# Patient Record
Sex: Male | Born: 1940 | Race: White | Hispanic: No | Marital: Married | Smoking: Former smoker
Health system: Southern US, Community
[De-identification: ages and names within clinical notes are randomized; demographics above are authoritative.]

## PROBLEM LIST (undated history)

## (undated) DIAGNOSIS — Z7901 Long term (current) use of anticoagulants: Secondary | ICD-10-CM

## (undated) DIAGNOSIS — Z79899 Other long term (current) drug therapy: Secondary | ICD-10-CM

## (undated) DIAGNOSIS — J45909 Unspecified asthma, uncomplicated: Secondary | ICD-10-CM

## (undated) DIAGNOSIS — I428 Other cardiomyopathies: Secondary | ICD-10-CM

## (undated) DIAGNOSIS — K4090 Unilateral inguinal hernia, without obstruction or gangrene, not specified as recurrent: Secondary | ICD-10-CM

## (undated) DIAGNOSIS — K5790 Diverticulosis of intestine, part unspecified, without perforation or abscess without bleeding: Secondary | ICD-10-CM

## (undated) DIAGNOSIS — K8020 Calculus of gallbladder without cholecystitis without obstruction: Secondary | ICD-10-CM

## (undated) DIAGNOSIS — I499 Cardiac arrhythmia, unspecified: Secondary | ICD-10-CM

## (undated) DIAGNOSIS — G4733 Obstructive sleep apnea (adult) (pediatric): Secondary | ICD-10-CM

## (undated) DIAGNOSIS — D696 Thrombocytopenia, unspecified: Secondary | ICD-10-CM

## (undated) DIAGNOSIS — R001 Bradycardia, unspecified: Secondary | ICD-10-CM

## (undated) DIAGNOSIS — I480 Paroxysmal atrial fibrillation: Secondary | ICD-10-CM

## (undated) DIAGNOSIS — L570 Actinic keratosis: Secondary | ICD-10-CM

## (undated) DIAGNOSIS — R55 Syncope and collapse: Secondary | ICD-10-CM

## (undated) HISTORY — DX: Calculus of gallbladder without cholecystitis without obstruction: K80.20

## (undated) HISTORY — PX: CHOLECYSTECTOMY: SHX55

## (undated) HISTORY — DX: Thrombocytopenia, unspecified: D69.6

## (undated) HISTORY — DX: Obstructive sleep apnea (adult) (pediatric): G47.33

## (undated) HISTORY — DX: Unspecified asthma, uncomplicated: J45.909

## (undated) HISTORY — DX: Other cardiomyopathies: I42.8

## (undated) HISTORY — PX: CARDIOVERSION: SHX1299

## (undated) HISTORY — DX: Cardiac arrhythmia, unspecified: I49.9

## (undated) HISTORY — DX: Actinic keratosis: L57.0

## (undated) HISTORY — DX: Other long term (current) drug therapy: Z79.899

## (undated) HISTORY — DX: Long term (current) use of anticoagulants: Z79.01

## (undated) HISTORY — DX: Diverticulosis of intestine, part unspecified, without perforation or abscess without bleeding: K57.90

## (undated) HISTORY — DX: Unilateral inguinal hernia, without obstruction or gangrene, not specified as recurrent: K40.90

---

## 2001-12-09 ENCOUNTER — Ambulatory Visit (HOSPITAL_COMMUNITY): Admission: RE | Admit: 2001-12-09 | Discharge: 2001-12-09 | Payer: Self-pay | Admitting: Internal Medicine

## 2008-06-11 DIAGNOSIS — K5790 Diverticulosis of intestine, part unspecified, without perforation or abscess without bleeding: Secondary | ICD-10-CM

## 2008-06-11 HISTORY — DX: Diverticulosis of intestine, part unspecified, without perforation or abscess without bleeding: K57.90

## 2008-07-27 ENCOUNTER — Encounter (INDEPENDENT_AMBULATORY_CARE_PROVIDER_SITE_OTHER): Payer: Self-pay | Admitting: *Deleted

## 2009-03-02 ENCOUNTER — Ambulatory Visit: Payer: Self-pay | Admitting: Internal Medicine

## 2009-03-04 ENCOUNTER — Telehealth: Payer: Self-pay | Admitting: Internal Medicine

## 2009-03-08 ENCOUNTER — Ambulatory Visit: Payer: Self-pay | Admitting: Internal Medicine

## 2009-05-18 ENCOUNTER — Ambulatory Visit (HOSPITAL_COMMUNITY): Admission: RE | Admit: 2009-05-18 | Discharge: 2009-05-19 | Payer: Self-pay | Admitting: General Surgery

## 2009-05-18 ENCOUNTER — Encounter (INDEPENDENT_AMBULATORY_CARE_PROVIDER_SITE_OTHER): Payer: Self-pay | Admitting: General Surgery

## 2010-06-11 DIAGNOSIS — I499 Cardiac arrhythmia, unspecified: Secondary | ICD-10-CM

## 2010-06-11 HISTORY — DX: Cardiac arrhythmia, unspecified: I49.9

## 2010-08-29 ENCOUNTER — Ambulatory Visit (HOSPITAL_COMMUNITY)
Admission: RE | Admit: 2010-08-29 | Discharge: 2010-08-29 | Disposition: A | Discharge status: Home / Self Care | Payer: 59 | Source: Ambulatory Visit | Attending: Cardiology | Admitting: Cardiology

## 2010-08-29 DIAGNOSIS — Z7901 Long term (current) use of anticoagulants: Secondary | ICD-10-CM | POA: Insufficient documentation

## 2010-08-29 DIAGNOSIS — I4891 Unspecified atrial fibrillation: Secondary | ICD-10-CM | POA: Insufficient documentation

## 2010-08-29 DIAGNOSIS — I428 Other cardiomyopathies: Secondary | ICD-10-CM | POA: Insufficient documentation

## 2010-08-29 DIAGNOSIS — Z01812 Encounter for preprocedural laboratory examination: Secondary | ICD-10-CM | POA: Insufficient documentation

## 2010-08-29 DIAGNOSIS — Z01810 Encounter for preprocedural cardiovascular examination: Secondary | ICD-10-CM | POA: Insufficient documentation

## 2010-08-29 LAB — BASIC METABOLIC PANEL
Chloride: 103 mEq/L (ref 96–112)
GFR calc Af Amer: >60 mL/min (ref >60)
GFR calc non Af Amer: >60 mL/min (ref >60)
Potassium: 3.6 mEq/L (ref 3.5–5.1)
Sodium: 137 mEq/L (ref 135–145)

## 2010-08-29 LAB — CBC
Hemoglobin: 15.4 g/dL (ref 13.0–17.0)
MCH: 30.3 pg (ref 26.0–34.0)
MCHC: 35 g/dL (ref 30.0–36.0)
MCV: 86.6 fL (ref 78.0–100.0)
Platelets: 88 10*3/uL — ABNORMAL LOW (ref 150–400)
RBC: 5.08 MIL/uL (ref 4.22–5.81)
RDW: 16.7 % — ABNORMAL HIGH (ref 11.5–15.5)
WBC: 6.6 10*3/uL (ref 4.0–10.5)

## 2010-08-29 LAB — MAGNESIUM: Magnesium: 2 mg/dL (ref 1.5–2.5)

## 2010-08-29 LAB — PROTIME-INR: Prothrombin Time: 32.2 seconds — ABNORMAL HIGH (ref 11.6–15.2)

## 2010-08-29 LAB — APTT: aPTT: 40 seconds — ABNORMAL HIGH (ref 24–37)

## 2010-08-30 NOTE — Op Note (Signed)
  NAME:  Joseph Allison, MEDINA NO.:  [id number]  MEDICAL RECORD NO.:  [id number]           PATIENT TYPE:  O  LOCATION:  [facility]                         FACILITY:  [facility]  PHYSICIAN:  Armanda Magic, M.D.     DATE OF BIRTH:  Jan 20, 1941  DATE OF PROCEDURE:  08/29/2010 DATE OF DISCHARGE:  08/29/2010                              OPERATIVE REPORT   REFERRING PHYSICIAN:  Dr. Berenda Morale.  PROCEDURE:  Current cardioversion.  OPERATOR:  Armanda Magic, MD  INDICATIONS:  Atrial fibrillation.  COMPLICATIONS:  None.  IV MEDICATIONS:  Propofol 160 mg and lidocaine 60 mg.  This is a 70 year old male who recently presented with new onset atrial fibrillation.  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  He has been on Coumadin with a therapeutic INR greater than 2 for over 4 weeks and now presents for cardioversion.  [facility] nonsedated state.  Informed consent was obtained.  The patient was connected to continuous heart rate and pulse oximetry monitoring and intermittent blood pressure monitoring.  Defibrillator pads were placed in the left anterior chest and posterior back.  After adequate anesthesia was obtained, a 150-joule synchronized biphasic shock was delivered, which successfully converted the patient to sinus bradycardia.  The patient tolerated the procedure well without complications.  The patient subsequently was later discharged to home after fully awake and ambulating.  ASSESSMENT: 1. Atrial fibrillation. 2. Systemic anticoagulation with therapeutic INR for 4 weeks. 3. Successful cardioversion to sinus bradycardia.  PLAN:  Discharged home after fully awake and ambulating.  We will follow up with my nurse practitioner in 2 weeks dictation     Armanda Magic, M.D.     TT/MEDQ  D:  08/29/2010  T:  08/30/2010  Job:  [id number]  cc:    Berenda Morale, MD  Electronically Signed by Armanda Magic M.D. on 08/30/2010 09:31:46 AM

## 2010-09-12 LAB — COMPREHENSIVE METABOLIC PANEL
ALT: 21 U/L (ref 0–53)
Albumin: 4.1 g/dL (ref 3.5–5.2)
Alkaline Phosphatase: 69 U/L (ref 39–117)
BUN: 13 mg/dL (ref 6–23)
Chloride: 103 mEq/L (ref 96–112)
Glucose, Bld: 83 mg/dL (ref 70–99)
Potassium: 4.2 mEq/L (ref 3.5–5.1)
Sodium: 141 mEq/L (ref 135–145)
Total Bilirubin: 2.9 mg/dL — ABNORMAL HIGH (ref 0.3–1.2)

## 2010-09-12 LAB — DIFFERENTIAL
Basophils Absolute: 0 10*3/uL (ref 0.0–0.1)
Basophils Relative: 1 % (ref 0–1)
Eosinophils Absolute: 0.1 10*3/uL (ref 0.0–0.7)
Monocytes Absolute: 0.4 10*3/uL (ref 0.1–1.0)
Neutro Abs: 4.6 10*3/uL (ref 1.7–7.7)

## 2010-09-12 LAB — CBC
HCT: 42.3 % (ref 39.0–52.0)
Hemoglobin: 14.5 g/dL (ref 13.0–17.0)
Platelets: 134 10*3/uL — ABNORMAL LOW (ref 150–400)
WBC: 6.2 10*3/uL (ref 4.0–10.5)

## 2010-09-12 LAB — APTT: aPTT: 29 seconds (ref 24–37)

## 2010-12-26 ENCOUNTER — Ambulatory Visit (HOSPITAL_COMMUNITY)
Admission: RE | Admit: 2010-12-26 | Discharge: 2010-12-26 | Disposition: A | Discharge status: Home / Self Care | Payer: 59 | Source: Ambulatory Visit | Attending: Cardiology | Admitting: Cardiology

## 2010-12-26 DIAGNOSIS — Z7901 Long term (current) use of anticoagulants: Secondary | ICD-10-CM | POA: Insufficient documentation

## 2010-12-26 DIAGNOSIS — I428 Other cardiomyopathies: Secondary | ICD-10-CM | POA: Insufficient documentation

## 2010-12-26 DIAGNOSIS — I4891 Unspecified atrial fibrillation: Secondary | ICD-10-CM | POA: Insufficient documentation

## 2010-12-26 LAB — PROTIME-INR: INR: 2.96 — ABNORMAL HIGH (ref 0.00–1.49)

## 2010-12-26 LAB — MAGNESIUM: Magnesium: 1.9 mg/dL (ref 1.5–2.5)

## 2010-12-28 NOTE — Op Note (Signed)
  NAME:  Joseph Allison, ALVIAR NO.:  [id number]  MEDICAL RECORD NO.:  [id number]  LOCATION:  [facility]                         FACILITY:  [facility]  PHYSICIAN:  Armanda Magic, M.D.     DATE OF BIRTH:  10-13-1940  DATE OF PROCEDURE:  12/26/2010 DATE OF DISCHARGE:  12/26/2010                              OPERATIVE REPORT   REFERRING PHYSICIAN:  Kandyce Rud, MD  PROCEDURE:  Direct current cardioversion.  OPERATOR:  Armanda Magic, MD  INDICATIONS:  Atrial fibrillation.  COMPLICATIONS:  None.  MEDICATIONS:  Propofol 120 mg IV.  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  Of note, his EF had improved to 45% from in the 30s.  He now presents for cardioversion.  [facility] nonsedated state.  Informed consent was obtained.  The patient was connected to continuous heart rate and pulse oximetry monitoring and intermittent blood pressure monitoring.  Defibrillator pads were placed on the left anterior chest and back.  After adequate anesthesia was obtained, a 150- joule synchronized biphasic shock was delivered which was unsuccessful in converting the patient to sinus rhythm.  A 200-joule synchronized biphasic shock was delivered, which again was unsuccessful in converting the patient to sinus rhythm.  The patient tolerated the procedure well and was later discharged to home.  ASSESSMENT: 1. Atrial fibrillation. 2. Systemic anticoagulation with supratherapeutic INR at 3.99. 3. Unsuccessful cardioversion.  PLAN:  Discharge to home after fully awake and ambulating.  I have discussed the options for further treatment with antiarrhythmic drugs with his wife.  They would like to have a visit in the office to discuss his options.  We will set up an  appointment on Thursday, December 28, 2010, at 3:45 for further discussion.  Of note, his INR was 3.99 on December 19, 2010, so I have told him to hold his warfarin today and restart at regular dose tomorrow and he will follow up in our Coumadin Clinic at the time of his appointment on August 28, 2010.     Armanda Magic, M.D.     TT/MEDQ  D:  12/26/2010  T:  12/27/2010  Job:  [id number]  Electronically Signed by Armanda Magic M.D. on 12/28/2010 09:17:55 AM

## 2011-01-03 ENCOUNTER — Inpatient Hospital Stay (HOSPITAL_COMMUNITY)
Admission: AD | Admit: 2011-01-03 | Discharge: 2011-01-06 | DRG: 310 | Disposition: A | Discharge status: Home / Self Care | Payer: Managed Care, Other (non HMO) | Source: Ambulatory Visit | Attending: Cardiology | Admitting: Cardiology

## 2011-01-03 DIAGNOSIS — I498 Other specified cardiac arrhythmias: Secondary | ICD-10-CM | POA: Diagnosis present

## 2011-01-03 DIAGNOSIS — I428 Other cardiomyopathies: Secondary | ICD-10-CM | POA: Diagnosis present

## 2011-01-03 DIAGNOSIS — G4733 Obstructive sleep apnea (adult) (pediatric): Secondary | ICD-10-CM | POA: Diagnosis present

## 2011-01-03 DIAGNOSIS — R791 Abnormal coagulation profile: Secondary | ICD-10-CM | POA: Diagnosis present

## 2011-01-03 DIAGNOSIS — I4891 Unspecified atrial fibrillation: Principal | ICD-10-CM | POA: Diagnosis present

## 2011-01-03 LAB — CBC
Hemoglobin: 16.6 g/dL (ref 13.0–17.0)
MCH: 34.3 pg — ABNORMAL HIGH (ref 26.0–34.0)
Platelets: 108 10*3/uL — ABNORMAL LOW (ref 150–400)
RBC: 4.84 MIL/uL (ref 4.22–5.81)
WBC: 6.8 10*3/uL (ref 4.0–10.5)

## 2011-01-03 LAB — MAGNESIUM: Magnesium: 2.1 mg/dL (ref 1.5–2.5)

## 2011-01-03 LAB — BASIC METABOLIC PANEL
CO2: 32 mEq/L (ref 19–32)
CO2: 31 mEq/L (ref 19–32)
Calcium: 9 mg/dL (ref 8.4–10.5)
Chloride: 101 mEq/L (ref 96–112)
Creatinine, Ser: 0.85 mg/dL (ref 0.50–1.35)
GFR calc Af Amer: >60 mL/min (ref >60)
Glucose, Bld: 89 mg/dL (ref 70–99)
Potassium: 3.8 mEq/L (ref 3.5–5.1)
Potassium: 3.8 mEq/L (ref 3.5–5.1)
Sodium: 138 mEq/L (ref 135–145)
Sodium: 138 mEq/L (ref 135–145)

## 2011-01-03 LAB — DIFFERENTIAL
Basophils Absolute: 0 10*3/uL (ref 0.0–0.1)
Eosinophils Absolute: 0.1 10*3/uL (ref 0.0–0.7)
Lymphocytes Relative: 24 % (ref 12–46)
Monocytes Absolute: 0.5 10*3/uL (ref 0.1–1.0)
Neutrophils Relative %: 66 % (ref 43–77)

## 2011-01-03 LAB — PROTIME-INR
INR: 3.13 — ABNORMAL HIGH (ref 0.00–1.49)
Prothrombin Time: 32.7 seconds — ABNORMAL HIGH (ref 11.6–15.2)

## 2011-01-04 ENCOUNTER — Ambulatory Visit: Payer: 59 | Admitting: Hematology & Oncology

## 2011-01-04 LAB — PROTIME-INR
INR: 2.95 — ABNORMAL HIGH (ref 0.00–1.49)
Prothrombin Time: 31.2 seconds — ABNORMAL HIGH (ref 11.6–15.2)

## 2011-01-04 LAB — BASIC METABOLIC PANEL
CO2: 30 mEq/L (ref 19–32)
Calcium: 8.8 mg/dL (ref 8.4–10.5)
Chloride: 103 mEq/L (ref 96–112)
Creatinine, Ser: 0.64 mg/dL (ref 0.50–1.35)
GFR calc Af Amer: >60 mL/min (ref >60)
Sodium: 139 mEq/L (ref 135–145)

## 2011-01-04 LAB — MAGNESIUM: Magnesium: 2.1 mg/dL (ref 1.5–2.5)

## 2011-01-05 LAB — PROTIME-INR: Prothrombin Time: 29.8 seconds — ABNORMAL HIGH (ref 11.6–15.2)

## 2011-01-05 LAB — MAGNESIUM: Magnesium: 2 mg/dL (ref 1.5–2.5)

## 2011-01-05 LAB — BASIC METABOLIC PANEL
Calcium: 9.1 mg/dL (ref 8.4–10.5)
Creatinine, Ser: 0.59 mg/dL (ref 0.50–1.35)
GFR calc Af Amer: >60 mL/min (ref >60)
GFR calc non Af Amer: >60 mL/min (ref >60)

## 2011-01-06 LAB — BASIC METABOLIC PANEL
BUN: 14 mg/dL (ref 6–23)
Calcium: 9.4 mg/dL (ref 8.4–10.5)
Chloride: 99 mEq/L (ref 96–112)
Creatinine, Ser: 0.64 mg/dL (ref 0.50–1.35)
GFR calc Af Amer: >60 mL/min (ref >60)

## 2011-01-11 NOTE — Discharge Summary (Signed)
NAME:  Joseph Allison, Joseph Allison NO.:  [id number]  MEDICAL RECORD NO.:  [id number]  LOCATION:  3706                         FACILITY:  [facility]  PHYSICIAN:  Armanda Magic, M.D.     DATE OF BIRTH:  02/10/1941  DATE OF ADMISSION:  01/03/2011 DATE OF DISCHARGE:  01/06/2011                              DISCHARGE SUMMARY   ADMISSION DIAGNOSES: 1. Atrial fibrillation, status post failed cardioversion. 2. Dilated cardiomyopathy, felt secondary to tachycardia-induced     cardiomyopathy. 3. Moderate obstructive sleep apnea.  DISCHARGE DIAGNOSES: 1. Atrial fibrillation with chemical cardioversion to sinus     bradycardia on Tikosyn. 2. Sinus bradycardia. 3. Systemic anticoagulation with therapeutic INR. 4. Dilated cardiomyopathy, felt secondary to tachycardia-induced     cardiomyopathy. 5. Moderate obstructive sleep apnea.  PROCEDURES:  None.  This is a 70 year old male who has a history of paroxysmal atrial fibrillation dating back to January 2012 at which time he was admitted with heart failure and was diagnosed with dilated cardiomyopathy felt secondary to tachycardia-induced cardiomyopathy from his atrial fibrillation.  He was loaded on Coumadin and subsequently underwent cardioversion in March 2012.  He then reverted back to atrial fibrillation despite metoprolol.  He underwent recent cardioversion unsuccessfully and presented on January 03, 2011, for drug loading with Tikosyn.  HOSPITAL COURSE:  The patient on admission had a low potassium at 3.8 and that was repleted.  His magnesium was normal at 2.1.  He was subsequently started on Tikosyn 500 mcg q.12 h.  His QTc was maintained below 500 milliseconds.  He did have some issues with keeping his potassium above 4, so his potassium was increased to 40 mEq a day and Aldactone 25 mg 1/2 tablet daily was added.  He did have some bradycardia during his hospital stay that was asymptomatic and his metoprolol was stopped.  On  the day of discharge, he was doing well and ambulating without complaints.  No arrhythmias were documented on his telemetry and he was in sinus bradycardia.  He received his fifth dose of Tikosyn prior to discharge and the EKG with QTc 2 hours after Tikosyn dose was pending at the time of discharge.  His discharge medications include: 1. Aldactone 25 mg 1/2 tablet daily. 2. Dofetilide 500 mcg 1 tablet every 12 hours. 3. Potassium chloride 40 mEq 1 tablet daily. 4. Centrum Silver multivitamin daily. 5. Furosemide 20 mg daily. 6. Glucosamine/chondroitin 2 tablets daily. 7. Ramipril 5 mg daily. 8. Warfarin 1 mg 1 tablet every evening.  He was instructed to stop his metoprolol.  FOLLOWUP:  He will follow up with me in my office on January 12, 2011, at 9:45 a.m.  He will go to my lab on January 08, 2011, at 9 a.m. for a basic metabolic panel and he will follow up in Coumadin Clinic with Alfonse Ras on January 12, 2011, at 9:45 a.m.  I have spent a total of 45 minutes in preparing this patient's discharge including preparing this discharge note, reviewing the patient's medications and followup with him, preparing the patient's discharge medication prescriptions and discharge medication list as well as dictating this dictation.     Armanda Magic, M.D.     TT/MEDQ  D:  01/06/2011  T:  01/06/2011  Job:  [id number]  cc:   Kandyce Rud, MD  Electronically Signed by Armanda Magic M.D. on 01/11/2011 08:54:09 AM

## 2011-01-11 NOTE — H&P (Signed)
NAME:  Joseph Allison, Joseph Allison NO.:  [id number]  MEDICAL RECORD NO.:  [id number]  LOCATION:  3706                         FACILITY:  [facility]  PHYSICIAN:  Armanda Magic, M.D.     DATE OF BIRTH:  11-24-1940  DATE OF ADMISSION:  01/03/2011 DATE OF DISCHARGE:  01/06/2011                             HISTORY & PHYSICAL   CHIEF COMPLAINT:  Atrial fibrillation.  HISTORY OF PRESENT ILLNESS:  This is a 70 year old male with a history of paroxysmal atrial fibrillation dating back to January 2012, at which time he was admitted with heart failure, was diagnosed with a dilated cardiomyopathy felt secondary to tachycardia-induced cardiomyopathy from his atrial fibrillation.  He was loaded on Coumadin, subsequently underwent cardioversion in March 2012.  He then reverted back to atrial fibrillation despite metoprolol.  The patient underwent recent cardioversion which was unsuccessful and presents today for drug loading with Tikosyn.  Of note, Tikosyn was chosen because of the patient's cardiomyopathy, present use of Rythmol or flecainide.  The patient is adamant that he does not want to try amiodarone.  PAST MEDICAL HISTORY: 1. Atrial fibrillation. 2. Systemic anticoagulation. 3. 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  EF is improved to 40-45%. 4. Obstructive sleep apnea, on BiPAP therapy. 5. Inguinal hernia.  SURGICAL HISTORY:  Status post cholecystectomy in December 2011.  FAMILY HISTORY:  His father was killed in World War II.  His mother is deceased from colon and breast CA.  Has one sister alive with cervical CA.  SOCIAL HISTORY:  He is married.  He is a former smoker and smoked for 40 years.  He denies any alcohol use or IV drug use.  ALLERGIES:  None.  CURRENT MEDICATIONS: 1. Multivitamin daily. 2. Glucosamine 500 mg 2 tablets daily. 3. Lasix 20 mg daily. 4. Ramipril 5 mg daily. 5. Metoprolol tartrate  100 mg one-half tablet b.i.d. 6. Warfarin.  REVIEW OF SYSTEMS:  Other than what is stated in the HPI is negative.  PHYSICAL EXAMINATION:  GENERAL:  This is a well-developed, well- nourished male, in no acute distress. HEENT:  Benign. NECK:  Supple without lymphadenopathy.  Carotid strokes are +2 bilaterally, no bruits. LUNGS:  Clear to auscultation throughout. HEART:  Irregularly irregular.  No murmurs, rubs, or gallops.  Normal S1 and S2. ABDOMEN:  Soft, nontender, nondistended.  Normoactive bowel sounds.  No hepatosplenomegaly. EXTREMITIES:  No edema.  LABORATORY DATA:  Labs are pending at the time of admission.  EKG shows atrial fibrillation with controlled ventricular response.  ASSESSMENT: 1. Atrial fibrillation, status post failed cardioversion, now admitted     for drug loading with Tikosyn. 2. Dilated cardiomyopathy, felt secondary to tachycardia-induced     cardiomyopathy with improvement in the EF with control of heart     rate, EF now 45% by recent echocardiogram. 3. Moderate obstructive sleep apnea.  PLAN:  We will admit to telemetry bed.  We will check magnesium and potassium, and if levels are within recommended guidelines of potassium greater than 4, magnesium greater than 2, we will start Tikosyn at 500 mg q.12 h.  We will follow his QTC  closely.  If after 5 doses of Tikosyn he has not converted spontaneously on his own, we will plan direct current cardioversion before discharge.  He will continue on systemic anticoagulation per pharmacy protocol.     Armanda Magic, M.D.     TT/MEDQ  D:  01/09/2011  T:  01/10/2011  Job:  [id number]  Electronically Signed by Armanda Magic M.D. on 01/11/2011 08:54:13 AM

## 2011-01-29 ENCOUNTER — Ambulatory Visit (HOSPITAL_BASED_OUTPATIENT_CLINIC_OR_DEPARTMENT_OTHER): Payer: Managed Care, Other (non HMO) | Admitting: Hematology & Oncology

## 2011-01-29 ENCOUNTER — Other Ambulatory Visit: Payer: Self-pay | Admitting: Hematology & Oncology

## 2011-01-29 DIAGNOSIS — D6949 Other primary thrombocytopenia: Secondary | ICD-10-CM

## 2011-01-29 LAB — CBC WITH DIFFERENTIAL (CANCER CENTER ONLY)
BASO#: 0 10*3/uL (ref 0.0–0.2)
EOS%: 0.6 % (ref 0.0–7.0)
HCT: 43.9 % (ref 38.7–49.9)
HGB: 16.7 g/dL (ref 13.0–17.1)
LYMPH#: 1 10*3/uL (ref 0.9–3.3)
MCHC: 38 g/dL — ABNORMAL HIGH (ref 32.0–35.9)
MONO#: 0.5 10*3/uL (ref 0.1–0.9)
NEUT%: 76.1 % (ref 40.0–80.0)

## 2011-01-29 LAB — TECHNOLOGIST REVIEW CHCC SATELLITE

## 2011-01-31 LAB — VITAMIN B12: Vitamin B-12: 813 pg/mL (ref 211–911)

## 2011-01-31 LAB — PROTEIN ELECTROPHORESIS, SERUM
Albumin ELP: 65 % (ref 55.8–66.1)
Alpha-1-Globulin: 3.1 % (ref 2.9–4.9)

## 2011-01-31 LAB — FERRITIN: Ferritin: 166 ng/mL (ref 22–322)

## 2011-04-03 ENCOUNTER — Encounter: Payer: Self-pay | Admitting: *Deleted

## 2011-04-25 ENCOUNTER — Encounter: Payer: Self-pay | Admitting: Hematology & Oncology

## 2011-04-25 ENCOUNTER — Other Ambulatory Visit: Payer: Self-pay | Admitting: Hematology & Oncology

## 2011-04-25 ENCOUNTER — Other Ambulatory Visit (HOSPITAL_BASED_OUTPATIENT_CLINIC_OR_DEPARTMENT_OTHER): Payer: Managed Care, Other (non HMO) | Admitting: Lab

## 2011-04-25 ENCOUNTER — Ambulatory Visit (HOSPITAL_BASED_OUTPATIENT_CLINIC_OR_DEPARTMENT_OTHER): Payer: Managed Care, Other (non HMO) | Admitting: Hematology & Oncology

## 2011-04-25 VITALS — BP 103/62 | HR 51 | Temp 96.7°F | Ht 69.5 in | Wt 171.0 lb

## 2011-04-25 DIAGNOSIS — D6949 Other primary thrombocytopenia: Secondary | ICD-10-CM

## 2011-04-25 DIAGNOSIS — D696 Thrombocytopenia, unspecified: Secondary | ICD-10-CM | POA: Insufficient documentation

## 2011-04-25 LAB — CBC WITH DIFFERENTIAL (CANCER CENTER ONLY)
BASO#: 0 10*3/uL (ref 0.0–0.2)
BASO%: 0.5 % (ref 0.0–2.0)
EOS%: 0.8 % (ref 0.0–7.0)
HCT: 39.6 % (ref 38.7–49.9)
HGB: 14.8 g/dL (ref 13.0–17.1)
LYMPH#: 0.9 10*3/uL (ref 0.9–3.3)
LYMPH%: 14.6 % (ref 14.0–48.0)
MCHC: 37.4 g/dL — ABNORMAL HIGH (ref 32.0–35.9)
MCV: 92 fL (ref 82–98)
NEUT%: 76.2 % (ref 40.0–80.0)
RDW: 14.5 % (ref 11.1–15.7)

## 2011-04-25 NOTE — Progress Notes (Signed)
CC:   Armanda Magic, M.D.  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  INTERIM HISTORY:  Joseph Allison comes in for followup.  We initially saw him back in August.  At that point in time, his platelet count was 112,000. We did a workup on him.  All his lab work looked okay.  Vitamin B12 is 813.  His LDH was normal at 181.  There was no monoclonal spike in his serum.  He has had no bleeding or bruising.  He is on Coumadin.  He is being followed by Dr. Mayford Knife.  He has had no cough or shortness of breath.  There has been no leg swelling.  He has had no rashes.  PHYSICAL EXAM:  General:  This is a well-developed, well-nourished white gentleman in no obvious distress.  Vital signs:  Temperature 96.7, pulse 51, respiratory rate 18, blood pressure 103/62, weight is 171.  Head and neck:  Shows a normocephalic, atraumatic skull.  There are no ocular or oral lesions.  There are no palpable cervical, supraclavicular lymph nodes.  Lungs:  Clear bilaterally.  There are no rales, wheezes or rhonchi.  Cardiac:  Irregular rate and rhythm.  His rate is well controlled.  There are no murmurs, rubs or bruits.  Abdomen:  Soft. Good bowel sounds.  There is no fluid wave.  There is no palpable hepatosplenomegaly.  Back:  No tenderness over the spine, ribs, or hips. Extremities:  Shows no clubbing, cyanosis or edema.  He has good range motion of his joints.  Skin: No rashes, ecchymosis or petechiae.  LAB:  White blood cell count 6.4, hemoglobin 14.8, hematocrit 39.3, platelet count 107.  Peripheral smear shows a normochromic, normocytic population of red blood cells.  There are no nucleated red blood cells. I see no target cells.  There is no rouleaux formation.  White cells were normal in morphology and maturation.  There are no hypersegmented polys.  Platelets are slightly decreased in number.  He has several large platelets.  Platelets are  well granulated.  IMPRESSION:  Joseph Allison is a 70 year old gentleman with thrombocytopenia.  Again, this might be idiopathic thrombocytopenic purpura.  If so, he does not need to be treated.  I suppose it also could be medication-related.  His platelet count is stable from my point of view.  I still do not see a problem with him being on Coumadin.  We will go ahead and plan to get him back in another 4 months.  I do not think he needs any blood work in between visits.   ______________________________ [name], M.D. PRE/MEDQ  D:  04/25/2011  T:  04/25/2011  Job:  [id number]

## 2011-04-25 NOTE — Progress Notes (Signed)
This office note has been dictated. CSN: [id number] -

## 2011-08-29 ENCOUNTER — Ambulatory Visit (HOSPITAL_BASED_OUTPATIENT_CLINIC_OR_DEPARTMENT_OTHER): Payer: Managed Care, Other (non HMO) | Admitting: Hematology & Oncology

## 2011-08-29 ENCOUNTER — Other Ambulatory Visit (HOSPITAL_BASED_OUTPATIENT_CLINIC_OR_DEPARTMENT_OTHER): Payer: Managed Care, Other (non HMO) | Admitting: Lab

## 2011-08-29 VITALS — BP 112/59 | HR 50 | Temp 96.5°F | Ht 69.5 in | Wt 169.0 lb

## 2011-08-29 DIAGNOSIS — D696 Thrombocytopenia, unspecified: Secondary | ICD-10-CM

## 2011-08-29 LAB — CBC WITH DIFFERENTIAL (CANCER CENTER ONLY)
BASO#: 0 10*3/uL (ref 0.0–0.2)
BASO%: 0.5 % (ref 0.0–2.0)
Eosinophils Absolute: 0.1 10*3/uL (ref 0.0–0.5)
HCT: 43.1 % (ref 38.7–49.9)
HGB: 15.8 g/dL (ref 13.0–17.1)
LYMPH#: 1 10*3/uL (ref 0.9–3.3)
LYMPH%: 15.7 % (ref 14.0–48.0)
MCV: 93 fL (ref 82–98)
MONO#: 0.5 10*3/uL (ref 0.1–0.9)
NEUT%: 75.3 % (ref 40.0–80.0)
RBC: 4.64 10*6/uL (ref 4.20–5.70)
WBC: 6.4 10*3/uL (ref 4.0–10.0)

## 2011-08-29 LAB — CHCC SATELLITE - SMEAR

## 2011-08-29 NOTE — Progress Notes (Signed)
This office note has been dictated.

## 2011-08-29 NOTE — Progress Notes (Signed)
CC:   Joseph Allison, M.D. Joseph Finders., M.D.  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  CURRENT THERAPY:  Observation.  INTERIM HISTORY:  Joseph Allison comes in for his 4 month followup.  He is doing well.  He is on Coumadin.  He has had no bleeding issues. Otherwise, he has had no change in medications.  He has had no issues with nausea or vomiting.  There has been no weight loss or weight gain. He has had no change in bowel or bladder habits.  There has been no cough.  PHYSICAL EXAM:  General: This is a well-developed, well-nourished, white gentleman in no obvious distress.  Vital signs: Show a temperature of 96, pulse 50, respiratory 20, blood pressure 112/59, and weight is 169. Head and neck exam shows a normocephalic, atraumatic skull.  There are no ocular or oral lesions.  There are no palpable cervical or supraclavicular lymph nodes.  Lungs are clear bilaterally.  Cardiac exam: Regular rate and rhythm with an occasional extra beat.  He has a 1/6 systolic ejection murmur.  Abdominal exam: Soft with good bowel sounds.  There is no palpable abdominal mass.  There is no fluid wave. There is no palpable hepatosplenomegaly. Back exam:  No tenderness over the spine, ribs, or hips.  Extremities: Shows no clubbing, cyanosis, or edema.  Neurological exam: Shows no focal neurological deficits. Skin exam" shows no rashes, ecchymoses, or petechia.  LABORATORY STUDIES:  White cell count 6.4, hemoglobin 15.8, hematocrit 43.1, and platelet count 103.  Peripheral smear shows no immature myeloid cells.  There are no nucleated red cells.  There is no Rouleaux formation.  Platelets are mildly decreased in number.  Platelets are well-granulated.  He has a few large platelets.  IMPRESSION:  Joseph Allison is a 71 year old gentleman with mild thrombocytopenia.  This is the probably mild chronic immune thrombocytopenia.  It also could be  medication-related.  I do not see that we need to do a bone marrow test on him at this point in time.  We will plan to get back in another 4 months for followup.  I still do not see a problem with him being on Coumadin.    ______________________________ [name], M.D. PRE/MEDQ  D:  08/29/2011  T:  08/29/2011  Job:  [id number]

## 2011-10-26 ENCOUNTER — Other Ambulatory Visit: Payer: Self-pay | Admitting: Cardiology

## 2011-10-29 ENCOUNTER — Encounter (HOSPITAL_COMMUNITY): Payer: Self-pay | Admitting: Respiratory Therapy

## 2011-10-30 ENCOUNTER — Other Ambulatory Visit: Payer: Self-pay | Admitting: Cardiology

## 2011-11-01 ENCOUNTER — Encounter (HOSPITAL_COMMUNITY)
Admission: RE | Disposition: A | Discharge status: Home / Self Care | Payer: Self-pay | Source: Ambulatory Visit | Attending: Cardiology

## 2011-11-01 ENCOUNTER — Ambulatory Visit (HOSPITAL_COMMUNITY)
Admission: RE | Admit: 2011-11-01 | Discharge: 2011-11-01 | Disposition: A | Discharge status: Home / Self Care | Payer: Managed Care, Other (non HMO) | Source: Ambulatory Visit | Attending: Cardiology | Admitting: Cardiology

## 2011-11-01 DIAGNOSIS — I4891 Unspecified atrial fibrillation: Secondary | ICD-10-CM | POA: Insufficient documentation

## 2011-11-01 DIAGNOSIS — Z538 Procedure and treatment not carried out for other reasons: Secondary | ICD-10-CM | POA: Insufficient documentation

## 2011-11-01 DIAGNOSIS — Z01810 Encounter for preprocedural cardiovascular examination: Secondary | ICD-10-CM | POA: Insufficient documentation

## 2011-11-01 SURGERY — CARDIOVERSION
Anesthesia: General

## 2011-11-02 ENCOUNTER — Other Ambulatory Visit: Payer: Self-pay | Admitting: Cardiology

## 2011-11-02 NOTE — H&P (Signed)
Office Visit     Patient: Joseph Allison Provider: Armanda Magic, MD  DOB: 05-06-1941 Age: 71 Y Sex: Male Date: 10/25/2011  Phone: [phone]   Address: [address], [place], [id number]  Pcp: MARC BABAOFF       Subjective:     CC:    1. FOLLOW UP & SEE JEREMY.        HPI:  General:  The patient presents today for followup of his OSA/afib and DCM. He denies any chest pain, SOB, DOE, palpitations, LE edema, dizziness or syncope. He tolerates his medications well. He walks the dog twice daily for exercise. He is doing well on his BiPAP therapy. He tolerates the mask and pressure. He feels refreshed when he gets up in the morning and has no daytime sleepiness..        ROS:  See HPI, A twelve system review was perfomed at today's visit. For pertinent positives and negatives see HPI.       Medical History: AKs followed by derm, Inguinal hernia, mild inc in Allison bili 03/2009 at [facility], patient instructed to f/u at [facility] office. , h/o cholelithiasis- cholecystectomy 05/2010 per Dr. Andrey Campanile, 07/03/10 new Atrial fibrillation --placed on Coumadin therapy, 07/07/10 echocardiogram with EF 32%, LVH, Cardiolite without ischemia, OSA - moderate, cardioversion 3/12 from Atrial fibrillation, 2012 nuclear stress test without ischemia but pt has cardiomyopathy with EF 32%, moderated OSA on BIPAP at 19/15cm H2O.        Family History:        Social History:  General:  History of smoking cigarettes: Former smoker.  no Smoking, smoked 40 yrs ago.  no Alcohol.  Caffeine: yes.  no Recreational drug use.  no Diet.  Exercise: yes.  Occupation: employed, Airline pilot for [employer].  Marital Status: married.        Medications: Multi-Vitamin Tablet 1 tablet Once a day, Glucosamine 500 MG Tablet 2 tablets daily, Potassium Chloride 40 MEQ Capsule Extended Release 1 capsule Once a day, Tikosyn 500 MCG Capsule 1 capsule Twice a day, Lasix 20 MG Tablet 1 tablet Once a day, Aldactone 25 MG Tablet 1/2  tablet Once a day, Ramipril 5 MG Capsule 1 capsule Once a day, Warfarin Sodium 1 MG Tablet per pharmD 1 mg qd except 2 mg M/F, Medication List reviewed and reconciled with the patient       Allergies: N.K.D.A.      Objective:     Vitals: Wt 170, Wt change -1 lb, Ht 71.5, BMI 23.38, Pulse sitting 56 irr, BP sitting 124/74.       Examination:  Cardiology, General:  GENERAL APPEARANCE: pleasant, NAD.  HEENT: unremarkable.  CAROTID UPSTROKE: normal, no bruit.  JVD: flat.  HEART SOUNDS: regular, normal S1, S2, no S3 or S4.  MURMUR: absent.  LUNGS: no rales or wheezes.  ABDOMEN: soft, non tender, positive bowel sounds, no masses felt.  EXTREMITIES: no leg edema.  PERIPHERAL PULSES: 2 plus bilateral.        Assessment:     Assessment:  1. Atrial fibrillation - 427.31 (Primary)  2. Anticoagulant long-term use - V58.61  3. Cardiomyopathy - 425.4  4. Obstructive sleep apnea (adult) (pediatric) - 327.23    Plan:     1. Atrial fibrillation Continue Tikosyn Capsule, 500 MCG, 1 capsule, Orally, Twice a day ; Continue Warfarin Sodium Tablet, 1 MG, per pharmD, Orally, 1 mg qd except 2 mg M/F ; Start Toprol XL Tablet Extended Release 24 Hour, 25 MG, 1  tablet, Orally, Once a day, 30 day(s), 30 .  Diagnostic Imaging:EKG atrial fibrillation with RVR, Josey,Butch 10/25/2011 09:03:51 AM > Alicianna Litchford M 10/25/2011 09:21:01 AM >  He is back in afib with RVR so I will add Toprol XL 25mg  daily and have him come back in for EKG tomorrow to make sure he his rate controlled. If he has been therapuetic with his INR for the past few times then we will set him up for DCCV.       2. Anticoagulant long-term use  The patient's PT/INR will be checked in clininc today and anticoagulation adjusted as needed and reviewed by me - refer to coumadin clinic note.       3. Cardiomyopathy Continue Lasix Tablet, 20 MG, 1 tablet, Orally, Once a day ; Continue Aldactone Tablet, 25 MG, 1/2 tablet, Orally, Once a day ;  Continue Ramipril Capsule, 5 MG, 1 capsule, Orally, Once a day .        Immunizations:        Labs:        Procedure Codes: 19147 EKG I AND R       Preventive:         Follow Up: 5/17 EKG check (Reason: afib/OSA/DCM)      Provider: Armanda Magic, MD  Patient: Joseph Allison DOB: 1940/08/01 Date: 10/25/2011

## 2011-12-26 ENCOUNTER — Ambulatory Visit (HOSPITAL_BASED_OUTPATIENT_CLINIC_OR_DEPARTMENT_OTHER): Payer: Managed Care, Other (non HMO) | Admitting: Hematology & Oncology

## 2011-12-26 ENCOUNTER — Other Ambulatory Visit (HOSPITAL_BASED_OUTPATIENT_CLINIC_OR_DEPARTMENT_OTHER): Payer: Managed Care, Other (non HMO) | Admitting: Lab

## 2011-12-26 VITALS — BP 91/53 | HR 48 | Temp 97.1°F | Ht 69.0 in | Wt 167.0 lb

## 2011-12-26 DIAGNOSIS — D696 Thrombocytopenia, unspecified: Secondary | ICD-10-CM

## 2011-12-26 DIAGNOSIS — D693 Immune thrombocytopenic purpura: Secondary | ICD-10-CM

## 2011-12-26 LAB — CBC WITH DIFFERENTIAL (CANCER CENTER ONLY)
BASO#: 0 10*3/uL (ref 0.0–0.2)
Eosinophils Absolute: 0.1 10*3/uL (ref 0.0–0.5)
HCT: 40.2 % (ref 38.7–49.9)
HGB: 14.7 g/dL (ref 13.0–17.1)
LYMPH%: 18.7 % (ref 14.0–48.0)
MCH: 33.9 pg — ABNORMAL HIGH (ref 28.0–33.4)
MCV: 93 fL (ref 82–98)
MONO#: 0.5 10*3/uL (ref 0.1–0.9)
NEUT%: 72.1 % (ref 40.0–80.0)
RBC: 4.34 10*6/uL (ref 4.20–5.70)
WBC: 6.7 10*3/uL (ref 4.0–10.0)

## 2011-12-26 LAB — CHCC SATELLITE - SMEAR

## 2011-12-26 NOTE — Progress Notes (Signed)
This office note has been dictated.

## 2011-12-27 NOTE — Progress Notes (Signed)
CC:   Armanda Magic, M.D.  DIAGNOSIS:  Chronic immune thrombocytopenia.  CURRENT THERAPY:  Observation.  INTERIM HISTORY:  Mr. Joseph Allison comes in for followup.  We last saw him back in March.  Since we have been seeing him his platelet count really has been holding steady.  He is on Coumadin.  He continues on Coumadin.  He has this monitored every month or so.  Thankfully, there has been no problems with bleeding.  He has had no change in his medications.  He has not noticed any rashes. He has had no joint aches or pains.  He has not noticed any cough. There is no headache.  PHYSICAL EXAMINATION:  This is a well-developed, well-nourished white gentleman in no obvious distress.  Vital signs:  Temperature of 97.1, pulse 48, respiratory rate 18, blood pressure, 91/53, weight is 167. Head and neck:  Normocephalic, atraumatic skull.  There are no ocular or oral lesions.  There are no palpable cervical or supraclavicular lymph nodes.  Lungs:  Clear bilaterally.  Cardiac:  Regular rate and rhythm with a normal S1 and S2.  There are no murmurs, rubs or bruits. Abdomen:  Soft with good bowel sounds.  There is no palpable abdominal mass.  There is no palpable hepatosplenomegaly.  Back:  No tenderness of the spine, ribs, or hips.  Extremities:  Shows no clubbing, cyanosis or edema.  He has good range motion of his joints.  Neurological:  Shows no focal neurological deficits.  LABORATORY STUDIES:  White cell count is 86.7, hemoglobin 14.7, hematocrit 40.2, platelet count 111.  Peripheral smear shows good maturation of his red blood cells and white blood cells.  There are no nucleated red blood cells.  I see no teardrop cells.  He has no hypersegmented polys.  There is no immature myeloid or lymphoid cells. I do not see any blasts.  Platelets are adequate in number and size. Platelets are well-granulated.  He has a few large platelets.  IMPRESSION:  Mr. Joseph Allison is a 71 year old gentleman with  immune thrombocytopenia.  I suspect that this is likely the diagnosis for him.  His platelet count has really not changed in 6 months or so.  We will plan to get him back now in 6 months' time.  I do not see any need for any lab work in between visits.   ______________________________ [name], M.D. PRE/MEDQ  D:  12/26/2011  T:  12/27/2011  Job:  [id number]

## 2012-06-25 ENCOUNTER — Other Ambulatory Visit: Payer: Self-pay | Admitting: *Deleted

## 2012-06-25 ENCOUNTER — Other Ambulatory Visit (HOSPITAL_BASED_OUTPATIENT_CLINIC_OR_DEPARTMENT_OTHER): Payer: Managed Care, Other (non HMO) | Admitting: Lab

## 2012-06-25 ENCOUNTER — Ambulatory Visit (HOSPITAL_BASED_OUTPATIENT_CLINIC_OR_DEPARTMENT_OTHER): Payer: Managed Care, Other (non HMO) | Admitting: Medical

## 2012-06-25 VITALS — BP 94/64 | HR 51 | Temp 97.3°F | Resp 18 | Ht 69.0 in | Wt 173.0 lb

## 2012-06-25 DIAGNOSIS — D693 Immune thrombocytopenic purpura: Secondary | ICD-10-CM

## 2012-06-25 DIAGNOSIS — D696 Thrombocytopenia, unspecified: Secondary | ICD-10-CM

## 2012-06-25 LAB — CBC WITH DIFFERENTIAL (CANCER CENTER ONLY)
BASO#: 0 10*3/uL (ref 0.0–0.2)
EOS%: 1 % (ref 0.0–7.0)
HCT: 43.3 % (ref 38.7–49.9)
HGB: 15.9 g/dL (ref 13.0–17.1)
LYMPH#: 1 10*3/uL (ref 0.9–3.3)
LYMPH%: 18.2 % (ref 14.0–48.0)
MCH: 33.6 pg — ABNORMAL HIGH (ref 28.0–33.4)
MCHC: 36.7 g/dL — ABNORMAL HIGH (ref 32.0–35.9)
MCV: 92 fL (ref 82–98)
NEUT%: 72.6 % (ref 40.0–80.0)

## 2012-06-25 NOTE — Progress Notes (Signed)
Diagnosis: Chronic immune thrombocytopenia.  Current therapy: Observation.  Interim history: Joseph Allison presents today for an office followup visit.  Overall, he, reports, that he's been doing quite well.  He remains on Coumadin.  He does have this monitored every month.  He's not reporting any problems with bleeding.  He's had no changes in his medications.  His platelet count still continues to hold steady.  He, reports, he has a good appetite.  He denies any nausea, vomiting, diarrhea, constipation, chest pain, shortness of breath, or cough.  He denies any fevers, chills, or night sweats.  He denies any abdominal pain.  He denies any lower leg swelling.  He denies any visual changes, headaches, or rashes.  He is able to perform his activities of daily living without any hindrance or decline.  Review of Systems: Constitutional:Negative for malaise/fatigue, fever, chills, weight loss, diaphoresis, activity change, appetite change, and unexpected weight change.  HEENT: Negative for double vision, blurred vision, visual loss, ear pain, tinnitus, congestion, rhinorrhea, epistaxis sore throat or sinus disease, oral pain/lesion, tongue soreness Respiratory: Negative for cough, chest tightness, shortness of breath, wheezing and stridor.  Cardiovascular: Negative for chest pain, palpitations, leg swelling, orthopnea, PND, DOE or claudication Gastrointestinal: Negative for nausea, vomiting, abdominal pain, diarrhea, constipation, blood in stool, melena, hematochezia, abdominal distention, anal bleeding, rectal pain, anorexia and hematemesis.  Genitourinary: Negative for dysuria, frequency, hematuria,  Musculoskeletal: Negative for myalgias, back pain, joint swelling, arthralgias and gait problem.  Skin: Negative for rash, color change, pallor and wound.  Neurological:. Negative for dizziness/light-headedness, tremors, seizures, syncope, facial asymmetry, speech difficulty, weakness, numbness, headaches and  paresthesias.  Hematological: Negative for adenopathy. Does not bruise/bleed easily.  Psychiatric/Behavioral:  Negative for depression, no loss of interest in normal activity or change in sleep pattern.   Physical Exam: This is a pleasant, well-developed, well-nourished, 73 year old, white gentleman, in no obvious distress Vitals: Temperature 97.3 degrees, pulse 51, respirations 18, blood pressure 94/64 weight 173 pounds HEENT reveals a normocephalic, atraumatic skull, no scleral icterus, no oral lesions  Neck is supple without any cervical or supraclavicular adenopathy.  Lungs are clear to auscultation bilaterally. There are no wheezes, rales or rhonci Cardiac is regular rate and rhythm with a normal S1 and S2. There are no murmurs, rubs, or bruits.  Abdomen is soft with good bowel sounds, there is no palpable mass. There is no palpable hepatosplenomegaly. There is no palpable fluid wave.  Musculoskeletal no tenderness of the spine, ribs, or hips.  Extremities there are no clubbing, cyanosis, or edema.  Skin no petechia, purpura or ecchymosis Neurologic is nonfocal.  Laboratory Data: White count 5.7, hemoglobin 15.9, hematocrit 43.3, platelets 107,000  Current Outpatient Prescriptions on File Prior to Visit  Medication Sig Dispense Refill  . dofetilide (TIKOSYN) 500 MCG capsule Take 500 mcg by mouth 2 (two) times daily.        . furosemide (LASIX) 20 MG tablet Take 20 mg by mouth daily.       Marland Kitchen glucosamine-chondroitin 500-400 MG tablet Take 2 tablets by mouth at bedtime.       . metoprolol succinate (TOPROL-XL) 25 MG 24 hr tablet Take 25 mg by mouth daily.      . Multiple Vitamins-Minerals (CENTRUM SILVER PO) Take 1 tablet by mouth daily.        . potassium chloride SA (K-DUR,KLOR-CON) 20 MEQ tablet Take 40 mEq by mouth daily.       . ramipril (ALTACE) 5 MG tablet Take 5 mg by  mouth daily.       Marland Kitchen spironolactone (ALDACTONE) 25 MG tablet Take 12.5 mg by mouth daily.        Marland Kitchen warfarin  (COUMADIN) 1 MG tablet Take 1 mg by mouth daily. Takes 2 mg on Monday and Friday all other days takes 1 mg.       Assessment/Plan: This is a pleasant, 72 year old, white gentleman, with the following issues:  #1.  Immune Thrombocytopenia.  Overall, his platelet count has really held steady.  He is not symptomatic.  He's not reporting any obvious, bleeding.  We will continue to monitor his blood work.  #2.  We will follow back up with [name] in 6 months, but before then should there be questions or concerns.

## 2012-08-06 ENCOUNTER — Telehealth: Payer: Self-pay | Admitting: Hematology & Oncology

## 2012-08-06 NOTE — Telephone Encounter (Signed)
Pt moved 7-16 to 7-15

## 2012-12-23 ENCOUNTER — Other Ambulatory Visit (HOSPITAL_BASED_OUTPATIENT_CLINIC_OR_DEPARTMENT_OTHER): Payer: Managed Care, Other (non HMO) | Admitting: Lab

## 2012-12-23 ENCOUNTER — Ambulatory Visit (HOSPITAL_BASED_OUTPATIENT_CLINIC_OR_DEPARTMENT_OTHER): Payer: Managed Care, Other (non HMO) | Admitting: Medical

## 2012-12-23 VITALS — BP 108/60 | HR 58 | Temp 97.4°F | Resp 18 | Ht 69.0 in | Wt 170.0 lb

## 2012-12-23 DIAGNOSIS — D696 Thrombocytopenia, unspecified: Secondary | ICD-10-CM

## 2012-12-23 LAB — CBC WITH DIFFERENTIAL (CANCER CENTER ONLY)
BASO#: 0 10*3/uL (ref 0.0–0.2)
Eosinophils Absolute: 0.1 10*3/uL (ref 0.0–0.5)
LYMPH%: 19.5 % (ref 14.0–48.0)
MCH: 34.2 pg — ABNORMAL HIGH (ref 28.0–33.4)
MCV: 94 fL (ref 82–98)
MONO%: 7.8 % (ref 0.0–13.0)
Platelets: 91 10*3/uL — ABNORMAL LOW (ref 145–400)
RBC: 4.44 10*6/uL (ref 4.20–5.70)

## 2012-12-23 LAB — CHCC SATELLITE - SMEAR

## 2012-12-23 NOTE — Progress Notes (Signed)
Diagnosis: Chronic immune thrombocytopenia.  Current therapy: Observation.  Interim history: Joseph Allison presents today for an office followup visit.  Overall, he, reports, that he's been doing quite well.  He remains on Coumadin.  He does have this monitored every month.  He's not reporting any problems with bleeding.  He's had no changes in his medications.  His platelet count still continues to hold steady.  He, reports, he has a good appetite.  He denies any nausea, vomiting, diarrhea, constipation, chest pain, shortness of breath, or cough.  He denies any fevers, chills, or night sweats.  He denies any abdominal pain.  He denies any lower leg swelling.  He denies any visual changes, headaches, or rashes.  He is able to perform his activities of daily living without any hindrance or decline.  Review of Systems: Constitutional:Negative for malaise/fatigue, fever, chills, weight loss, diaphoresis, activity change, appetite change, and unexpected weight change.  HEENT: Negative for double vision, blurred vision, visual loss, ear pain, tinnitus, congestion, rhinorrhea, epistaxis sore throat or sinus disease, oral pain/lesion, tongue soreness Respiratory: Negative for cough, chest tightness, shortness of breath, wheezing and stridor.  Cardiovascular: Negative for chest pain, palpitations, leg swelling, orthopnea, PND, DOE or claudication Gastrointestinal: Negative for nausea, vomiting, abdominal pain, diarrhea, constipation, blood in stool, melena, hematochezia, abdominal distention, anal bleeding, rectal pain, anorexia and hematemesis.  Genitourinary: Negative for dysuria, frequency, hematuria,  Musculoskeletal: Negative for myalgias, back pain, joint swelling, arthralgias and gait problem.  Skin: Negative for rash, color change, pallor and wound.  Neurological:. Negative for dizziness/light-headedness, tremors, seizures, syncope, facial asymmetry, speech difficulty, weakness, numbness, headaches and  paresthesias.  Hematological: Negative for adenopathy. Does not bruise/bleed easily.  Psychiatric/Behavioral:  Negative for depression, no loss of interest in normal activity or change in sleep pattern.   Physical Exam: This is a pleasant, well-developed, well-nourished, 73 year old, white gentleman, in no obvious distress Vitals: Temperature 97.4 degrees pulse 58 respirations 18 blood pressure 108/60 weight 170 HEENT reveals a normocephalic, atraumatic skull, no scleral icterus, no oral lesions  Neck is supple without any cervical or supraclavicular adenopathy.  Lungs are clear to auscultation bilaterally. There are no wheezes, rales or rhonci Cardiac is regular rate and rhythm with a normal S1 and S2. There are no murmurs, rubs, or bruits.  Abdomen is soft with good bowel sounds, there is no palpable mass. There is no palpable hepatosplenomegaly. There is no palpable fluid wave.  Musculoskeletal no tenderness of the spine, ribs, or hips.  Extremities there are no clubbing, cyanosis, or edema.  Skin no petechia, purpura or ecchymosis Neurologic is nonfocal.  Laboratory Data: White count 6.0 hemoglobin 15.2 hematocrit 41.9 platelets 91,000  Current Outpatient Prescriptions on File Prior to Visit  Medication Sig Dispense Refill  . dofetilide (TIKOSYN) 500 MCG capsule Take 500 mcg by mouth 2 (two) times daily.        . furosemide (LASIX) 20 MG tablet Take 20 mg by mouth daily.       Marland Kitchen glucosamine-chondroitin 500-400 MG tablet Take 2 tablets by mouth at bedtime.       . metoprolol succinate (TOPROL-XL) 25 MG 24 hr tablet Take 25 mg by mouth daily.      . Multiple Vitamins-Minerals (CENTRUM SILVER PO) Take 1 tablet by mouth daily.        . potassium chloride SA in the morning of the a little and a a small is a week she she she's not been and he is he is currently in a  last is a is a a we'll see and a the the day is a is [place] in 6 (K-DUR,KLOR-CON) 20 MEQ tablet Take 40 mEq by mouth daily.       .  ramipril (ALTACE) 5 MG tablet Take 5 mg by mouth daily.       Marland Kitchen spironolactone (ALDACTONE) 25 MG tablet Take 12.5 mg by mouth daily.        Marland Kitchen warfarin (COUMADIN) 1 MG tablet Take 1 mg by mouth daily. Takes 2 mg on Monday and Friday all other days takes 1 mg.       Assessment/Plan: This is a pleasant, 72 year old, white gentleman, with the following issues:  #1.  Immune Thrombocytopenia.  Overall, his platelet count has really held steady.  He is not symptomatic.  He's not reporting any obvious, bleeding.  We will continue to monitor his blood work.  #2.  We will follow back up with Joseph Allison in 6 months, but before then should there be questions or concerns.

## 2012-12-24 ENCOUNTER — Other Ambulatory Visit: Payer: Managed Care, Other (non HMO) | Admitting: Lab

## 2012-12-24 ENCOUNTER — Ambulatory Visit: Payer: Managed Care, Other (non HMO) | Admitting: Hematology & Oncology

## 2013-04-14 ENCOUNTER — Ambulatory Visit (INDEPENDENT_AMBULATORY_CARE_PROVIDER_SITE_OTHER): Payer: Managed Care, Other (non HMO) | Admitting: Pharmacist

## 2013-04-14 DIAGNOSIS — I4891 Unspecified atrial fibrillation: Secondary | ICD-10-CM

## 2013-05-02 ENCOUNTER — Encounter: Payer: Self-pay | Admitting: *Deleted

## 2013-05-02 ENCOUNTER — Encounter: Payer: Self-pay | Admitting: Cardiology

## 2013-05-02 DIAGNOSIS — G4733 Obstructive sleep apnea (adult) (pediatric): Secondary | ICD-10-CM | POA: Insufficient documentation

## 2013-05-02 DIAGNOSIS — K4090 Unilateral inguinal hernia, without obstruction or gangrene, not specified as recurrent: Secondary | ICD-10-CM | POA: Insufficient documentation

## 2013-05-02 DIAGNOSIS — J45909 Unspecified asthma, uncomplicated: Secondary | ICD-10-CM | POA: Insufficient documentation

## 2013-05-02 DIAGNOSIS — I428 Other cardiomyopathies: Secondary | ICD-10-CM | POA: Insufficient documentation

## 2013-05-05 ENCOUNTER — Encounter: Payer: Self-pay | Admitting: Cardiology

## 2013-05-05 ENCOUNTER — Ambulatory Visit (INDEPENDENT_AMBULATORY_CARE_PROVIDER_SITE_OTHER): Payer: Managed Care, Other (non HMO) | Admitting: Cardiology

## 2013-05-05 VITALS — BP 110/80 | HR 52 | Ht 69.0 in | Wt 175.0 lb

## 2013-05-05 DIAGNOSIS — G4733 Obstructive sleep apnea (adult) (pediatric): Secondary | ICD-10-CM

## 2013-05-05 DIAGNOSIS — I428 Other cardiomyopathies: Secondary | ICD-10-CM

## 2013-05-05 DIAGNOSIS — I429 Cardiomyopathy, unspecified: Secondary | ICD-10-CM

## 2013-05-05 DIAGNOSIS — I4891 Unspecified atrial fibrillation: Secondary | ICD-10-CM

## 2013-05-05 LAB — BASIC METABOLIC PANEL
BUN: 16 mg/dL (ref 6–23)
CO2: 33 mEq/L — ABNORMAL HIGH (ref 19–32)
Chloride: 99 mEq/L (ref 96–112)
Creatinine, Ser: 0.9 mg/dL (ref 0.4–1.5)

## 2013-05-05 NOTE — Patient Instructions (Signed)
Your physician recommends that you continue on your current medications as directed. Please refer to the Current Medication list given to you today.  Your physician recommends that you go to the lab today for a BMET Panel  You can bring your Download card in next week and I will download it in office for you and return it to you that day.  Your physician wants you to follow-up in: 6 Months with Dr. Sherlyn Lick will receive a reminder letter in the mail two months in advance. If you don't receive a letter, please call our office to schedule the follow-up appointment.

## 2013-05-05 NOTE — Progress Notes (Signed)
[address] [place], [place]  [address] Phone: [phone] Fax:  [phone]  Date:  05/05/2013   ID:  Joseph Allison, DOB Mar 27, 1941, MRN [id number]  PCP:  Quintella Reichert, MD  Cardiologist:  Armanda Magic, MD     History of Present Illness: Joseph Allison is a 71 y.o. male with a history of atrial fibrillation, OSA and nonischemic DCM who presents today for followup.  He denies any chest pain, SOB, DOE, LE edema, dizziness, palpitations or syncope.   Wt Readings from Last 3 Encounters:  05/05/13 175 lb (79.379 kg)  12/23/12 170 lb (77.111 kg)  06/25/12 173 lb (78.472 kg)     Past Medical History  Diagnosis Date  . Thrombocytopenia   . Atrial fibrillation   . Asthma   . Sleep apnea   . Cardiomyopathy     EF 32%  . OSA (obstructive sleep apnea)   . Inguinal hernia     Current Outpatient Prescriptions  Medication Sig Dispense Refill  . dofetilide (TIKOSYN) 500 MCG capsule Take 500 mcg by mouth 2 (two) times daily.        . furosemide (LASIX) 20 MG tablet Take 20 mg by mouth daily.       Marland Kitchen glucosamine-chondroitin 500-400 MG tablet Take 2 tablets by mouth at bedtime.       . metoprolol succinate (TOPROL-XL) 25 MG 24 hr tablet Take 25 mg by mouth daily.      . Multiple Vitamins-Minerals (CENTRUM SILVER PO) Take 1 tablet by mouth daily.        . potassium chloride SA (K-DUR,KLOR-CON) 20 MEQ tablet Take 40 mEq by mouth daily.       . ramipril (ALTACE) 5 MG tablet Take 5 mg by mouth daily.       Marland Kitchen spironolactone (ALDACTONE) 25 MG tablet Take 12.5 mg by mouth daily.        Marland Kitchen warfarin (COUMADIN) 1 MG tablet Take 1 mg by mouth daily. Takes 2 mg on Monday and Friday all other days takes 1 mg.       No current facility-administered medications for this visit.    Allergies:   No Known Allergies  Social History:  The patient  reports that he has quit smoking. He does not have any smokeless tobacco history on file. He reports that he does not drink alcohol or use  illicit drugs.   Family History:  The patient's family history is not on file.   ROS:  Please see the history of present illness.      All other systems reviewed and negative.   PHYSICAL EXAM: VS:  BP 110/80  Pulse 52  Ht 5\' 9"  (1.753 m)  Wt 175 lb (79.379 kg)  BMI 25.83 kg/m2 Well nourished, well developed, in no acute distress HEENT: normal Neck: no JVD Cardiac:  normal S1, S2; RRR; no murmur Lungs:  clear to auscultation bilaterally, no wheezing, rhonchi or rales Abd: soft, nontender, no hepatomegaly Ext: no edema Skin: warm and dry Neuro:  CNs 2-12 intact, no focal abnormalities noted  EKG:     Sinus bradycardia with no ST changes, QTC  ASSESSMENT AND PLAN:  1. PAF maintaining NSR on Tikosyn and Toprol  - continue Tikosyn/Toprol/Warfarin 2. Obstructive sleep   - he will bring in his download card to read 3. Nonischemic DCM - well compensated  - continue Lasix/ACE I/beta blocker/aldactone  - check BMET  Followup with me in 6 months  Signed, Traci  Mayford Knife, MD 05/05/2013 8:24 AM

## 2013-05-06 ENCOUNTER — Telehealth: Payer: Self-pay | Admitting: Cardiology

## 2013-05-06 NOTE — Telephone Encounter (Signed)
New message ° ° ° ° ° °Returning Joseph Allison's call °

## 2013-05-06 NOTE — Telephone Encounter (Signed)
Spoke with pt and made aware of lab results and added PCP into pts chart in [facility]

## 2013-05-12 ENCOUNTER — Other Ambulatory Visit: Payer: Self-pay | Admitting: Cardiology

## 2013-05-18 ENCOUNTER — Encounter: Payer: Self-pay | Admitting: Cardiology

## 2013-05-25 NOTE — Telephone Encounter (Signed)
Patient advised to take tylenol every 6 hours as needed for foot pain, and to avoid aspirin or NSAIDs for pain.  He states the pain is mild only.  He agrees to this.

## 2013-05-25 NOTE — Telephone Encounter (Signed)
New Message  Pt called// Foot is in pain and he wants to take an Asprin// Is this possible Please call back to assist

## 2013-05-28 ENCOUNTER — Ambulatory Visit (INDEPENDENT_AMBULATORY_CARE_PROVIDER_SITE_OTHER): Payer: Managed Care, Other (non HMO) | Admitting: *Deleted

## 2013-05-28 DIAGNOSIS — I4891 Unspecified atrial fibrillation: Secondary | ICD-10-CM

## 2013-06-17 ENCOUNTER — Encounter: Payer: Self-pay | Admitting: Cardiology

## 2013-06-17 ENCOUNTER — Encounter: Payer: Self-pay | Admitting: General Surgery

## 2013-06-22 ENCOUNTER — Other Ambulatory Visit: Payer: Self-pay | Admitting: Nurse Practitioner

## 2013-06-22 DIAGNOSIS — D696 Thrombocytopenia, unspecified: Secondary | ICD-10-CM

## 2013-06-23 ENCOUNTER — Other Ambulatory Visit (HOSPITAL_BASED_OUTPATIENT_CLINIC_OR_DEPARTMENT_OTHER): Payer: Managed Care, Other (non HMO) | Admitting: Lab

## 2013-06-23 ENCOUNTER — Ambulatory Visit (HOSPITAL_BASED_OUTPATIENT_CLINIC_OR_DEPARTMENT_OTHER): Payer: Managed Care, Other (non HMO) | Admitting: Hematology & Oncology

## 2013-06-23 ENCOUNTER — Encounter: Payer: Self-pay | Admitting: Hematology & Oncology

## 2013-06-23 VITALS — BP 87/58 | HR 92 | Temp 98.2°F | Resp 18 | Ht 69.0 in | Wt 178.0 lb

## 2013-06-23 DIAGNOSIS — D696 Thrombocytopenia, unspecified: Secondary | ICD-10-CM

## 2013-06-23 LAB — CBC WITH DIFFERENTIAL (CANCER CENTER ONLY)
BASO#: 0 10*3/uL (ref 0.0–0.2)
BASO%: 0.4 % (ref 0.0–2.0)
EOS ABS: 0.1 10*3/uL (ref 0.0–0.5)
EOS%: 0.7 % (ref 0.0–7.0)
HCT: 47 % (ref 38.7–49.9)
HEMOGLOBIN: 16.6 g/dL (ref 13.0–17.1)
LYMPH#: 1.4 10*3/uL (ref 0.9–3.3)
LYMPH%: 20 % (ref 14.0–48.0)
MCH: 33.1 pg (ref 28.0–33.4)
MCHC: 35.3 g/dL (ref 32.0–35.9)
MCV: 94 fL (ref 82–98)
MONO#: 0.6 10*3/uL (ref 0.1–0.9)
MONO%: 8.1 % (ref 0.0–13.0)
NEUT%: 70.8 % (ref 40.0–80.0)
NEUTROS ABS: 4.8 10*3/uL (ref 1.5–6.5)
PLATELETS: 124 10*3/uL — AB (ref 145–400)
RBC: 5.01 10*6/uL (ref 4.20–5.70)
RDW: 14.9 % (ref 11.1–15.7)
WBC: 6.8 10*3/uL (ref 4.0–10.0)

## 2013-06-23 LAB — CHCC SATELLITE - SMEAR

## 2013-06-23 LAB — VITAMIN B12: Vitamin B-12: 808 pg/mL (ref 211–911)

## 2013-06-23 LAB — LACTATE DEHYDROGENASE: LDH: 178 U/L (ref 94–250)

## 2013-06-23 LAB — FERRITIN CHCC: Ferritin: 152 ng/ml (ref 22–316)

## 2013-06-23 NOTE — Progress Notes (Signed)
This office note has been dictated.

## 2013-06-24 ENCOUNTER — Telehealth: Payer: Self-pay | Admitting: Cardiology

## 2013-06-24 DIAGNOSIS — Z79899 Other long term (current) drug therapy: Secondary | ICD-10-CM

## 2013-06-24 NOTE — Progress Notes (Signed)
CC:   Armanda Magic, M.D.  DIAGNOSIS:  Chronic immune thrombocytopenia.  CURRENT THERAPY:  Observation.  INTERIM HISTORY:  Mr. Faraone comes in for his followup.  We see him every 6 months.  Since we last saw him, he has been doing pretty good.  He does have atrial fibrillation.  He is on Coumadin.  He has had no problems with bleeding while on Coumadin.  He has not noted any problems with cough or shortness breath.  There has been no leg swelling.  He has had no rashes.  There has been no bruises.  PHYSICAL EXAMINATION:  General:  This is a well-developed, well- nourished white gentleman, in no obvious distress.  Vital Signs: Temperature of 98.2, pulse 109, respiratory rate 18, blood pressure 87/58 (repeated was 94/57), and weight is 178 pounds.  Head and Neck: Normocephalic, atraumatic skull.  There are no ocular or oral lesions. There are no palpable cervical or supraclavicular lymph nodes.  Lungs: Clear bilaterally.  Cardiac:  Regular rate and rhythm with a normal S1 and S2.  There are no murmurs, rubs, or bruits.  Abdomen:  Soft.  He has good bowel sounds.  There is no fluid wave.  There is no palpable abdominal mass.  There is no palpable hepatosplenomegaly.  Back:  No tenderness over the spine, ribs, or hips.  Extremities:  No clubbing, cyanosis, or edema.  Skin:  No rashes, ecchymoses or petechia.  LABORATORY STUDIES:  White cell count is 6.8, hemoglobin 16.6, hematocrit 47, platelet count 124.  MCV is 94.  IMPRESSION:  Mr. Brueckner is a very nice 73 year old gentleman.  He has thrombocytopenia.  I suspect that this is chronic immune thrombocytopenia.  __________ blood pressure.  This is on the low side.  He sees Dr. Mayford Knife of Cardiology.  If I will see her again soon to have some adjustments in his medications.  So far, he is pretty much asymptomatic with the low blood pressure, but again I think he may need to have some adjustments.  He does have cardiomyopathy with  a ejection fraction that is not all that great.  I will plan to see him back in another 6 months.    ______________________________ [name], M.D. PRE/MEDQ  D:  06/22/2013  T:  06/24/2013  Job:  [id number]

## 2013-06-24 NOTE — Telephone Encounter (Signed)
New message     Do you have his cpap card?

## 2013-06-25 ENCOUNTER — Ambulatory Visit: Payer: Managed Care, Other (non HMO) | Admitting: Hematology & Oncology

## 2013-06-25 ENCOUNTER — Other Ambulatory Visit: Payer: Managed Care, Other (non HMO) | Admitting: Lab

## 2013-06-25 NOTE — Telephone Encounter (Signed)
No it was returned to the pt yesterday.

## 2013-07-02 NOTE — Progress Notes (Signed)
Please have this patient come in to see the NP or PA this week due to low BP - see attached OV note

## 2013-07-02 NOTE — Progress Notes (Signed)
Will do!

## 2013-07-08 ENCOUNTER — Encounter: Payer: Self-pay | Admitting: Physician Assistant

## 2013-07-08 NOTE — Progress Notes (Signed)
[address] [place], [place]  [address] Phone: [phone] Fax:  [phone]  Date:  07/09/2013   ID:  Joseph Allison, DOB [birth year], MRN [id number]  PCP:  Quintella Reichert, MD  Cardiologist:  Dr. Armanda Magic      History of Present Illness: Joseph Allison is a 73 y.o. 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  CLite in 06/2010 neg for ischemia.  Last echo in 11/2010 with EF 45%.  He failed DCCV in 08/2010 and underwent Tikosyn load in 12/2010.  Last seen by Dr. Armanda Magic in 04/2013.    He saw his hematologist recently and was noted to have a low BP (87/58 => 94/57) and was asked to follow up here.  He denies syncope, near syncope or fatigue.  He occasionally gets lightheaded.  He denies chest pain, dyspnea, orthopnea, PND, edema.  No recent fevers, cough, vomiting, diarrhea, melena, hematochezia.    Recent Labs: 05/05/2013: Creatinine 0.9; Potassium 3.9  06/23/2013: Hemoglobin 16.6   Wt Readings from Last 3 Encounters:  07/09/13 183 lb (83.008 kg)  06/23/13 178 lb (80.74 kg)  05/05/13 175 lb (79.379 kg)     Past Medical History  Diagnosis Date  . Thrombocytopenia   . Atrial fibrillation     failed DCCV 08/2010; Tikoysn load 12/2010  . Asthma   . Sleep apnea   . NICM (nonischemic cardiomyopathy)     tachycardia mediated;  CLite (08/2010): no ischemia; EF 32%; Echo (11/2010):  EF 45%, mod BAE, mild AI, mild MR, mild to mod TR  . OSA (obstructive sleep apnea)   . Inguinal hernia     Current Outpatient Prescriptions  Medication Sig Dispense Refill  . dofetilide (TIKOSYN) 500 MCG capsule Take 500 mcg by mouth 2 (two) times daily.        . furosemide (LASIX) 20 MG tablet Take 20 mg by mouth daily.       Marland Kitchen glucosamine-chondroitin 500-400 MG tablet Take 2 tablets by mouth at bedtime.       . metoprolol succinate (TOPROL-XL) 25 MG 24 hr tablet Take 25 mg by mouth daily.      . Multiple Vitamins-Minerals (CENTRUM  SILVER PO) Take 1 tablet by mouth daily.        . potassium chloride SA (K-DUR,KLOR-CON) 20 MEQ tablet Take 40 mEq by mouth daily.       . ramipril (ALTACE) 5 MG capsule TAKE ONE CAPSULE BY MOUTH EVERY DAY  90 capsule  1  . spironolactone (ALDACTONE) 25 MG tablet Take 12.5 mg by mouth daily.        Marland Kitchen warfarin (COUMADIN) 1 MG tablet Take 1 mg by mouth daily. Takes 2 mg on Monday and Friday all other days takes 1 mg.       No current facility-administered medications for this visit.    Allergies:   Review of patient's allergies indicates no known allergies.   Social History:  The patient  reports that he quit smoking about 30 years ago. He has never used smokeless tobacco. He reports that he does not drink alcohol or use illicit drugs.   Family History:  The patient's family history is not on file.   ROS:  Please see the history of present illness.      All other systems reviewed and negative.   PHYSICAL EXAM: VS:  BP 140/70  Pulse 41  Ht 5\' 9"  (1.753 m)  Wt 183  lb (83.008 kg)  BMI 27.01 kg/m2 Well nourished, well developed, in no acute distress HEENT: normal Neck: no JVD Vascular:  No carotid bruits Cardiac:  normal S1, S2; RRR; no murmur Lungs:  clear to auscultation bilaterally, no wheezing, rhonchi or rales Abd: soft, nontender, no hepatomegaly Ext: no edema Skin: warm and dry Neuro:  CNs 2-12 intact, no focal abnormalities noted  EKG:  Sinus brady, HR 41, normal axis, QTc 422     ASSESSMENT AND PLAN:  1. Bradycardia:  His BP today is very similar to his prior BPs.  He has no symptoms to suggest he is symptomatic with low BP.  His HR is low but is fairly consistent with prior readings on his HR.  I do not think he is symptomatic.  However, I will have him cut back on his Toprol to 12.5 QD.  2. Atrial Fibrillation:  Maintaining NSR.  Continue coumadin.  Check BMET, Mg2+ (Tikosyn Rx).   3. Non-Ischemic Cardiomyopathy:  His EF has improved with NSR in addition to ACEI, beta  blocker, spironolactone.  He should remain on this regimen unless his BP starts to run too low to tolerate.  I have asked him to try and check his BP at home if possible.   4. Chronic Systolic CHF:  Volume stable.  Check BMET. 5. Sleep Apnea:  Continue CPAP.  6. Disposition:  F/u with Dr. Armanda Magicraci Turner as planned in 10/2013.  Signed, Tereso NewcomerScott Oakleigh Hesketh, PA-C  07/09/2013 8:35 AM

## 2013-07-09 ENCOUNTER — Telehealth: Payer: Self-pay | Admitting: *Deleted

## 2013-07-09 ENCOUNTER — Encounter: Payer: Self-pay | Admitting: General Surgery

## 2013-07-09 ENCOUNTER — Ambulatory Visit (INDEPENDENT_AMBULATORY_CARE_PROVIDER_SITE_OTHER): Payer: Managed Care, Other (non HMO)

## 2013-07-09 ENCOUNTER — Encounter: Payer: Self-pay | Admitting: Physician Assistant

## 2013-07-09 ENCOUNTER — Ambulatory Visit (INDEPENDENT_AMBULATORY_CARE_PROVIDER_SITE_OTHER): Payer: Managed Care, Other (non HMO) | Admitting: Physician Assistant

## 2013-07-09 VITALS — BP 140/70 | HR 41 | Ht 69.0 in | Wt 183.0 lb

## 2013-07-09 DIAGNOSIS — I428 Other cardiomyopathies: Secondary | ICD-10-CM

## 2013-07-09 DIAGNOSIS — R001 Bradycardia, unspecified: Secondary | ICD-10-CM

## 2013-07-09 DIAGNOSIS — I5022 Chronic systolic (congestive) heart failure: Secondary | ICD-10-CM

## 2013-07-09 DIAGNOSIS — I429 Cardiomyopathy, unspecified: Secondary | ICD-10-CM

## 2013-07-09 DIAGNOSIS — Z5181 Encounter for therapeutic drug level monitoring: Secondary | ICD-10-CM

## 2013-07-09 DIAGNOSIS — I5042 Chronic combined systolic (congestive) and diastolic (congestive) heart failure: Secondary | ICD-10-CM | POA: Insufficient documentation

## 2013-07-09 DIAGNOSIS — I4891 Unspecified atrial fibrillation: Secondary | ICD-10-CM

## 2013-07-09 DIAGNOSIS — G4733 Obstructive sleep apnea (adult) (pediatric): Secondary | ICD-10-CM

## 2013-07-09 DIAGNOSIS — I498 Other specified cardiac arrhythmias: Secondary | ICD-10-CM

## 2013-07-09 LAB — BASIC METABOLIC PANEL
BUN: 16 mg/dL (ref 6–23)
CHLORIDE: 100 meq/L (ref 96–112)
CO2: 31 mEq/L (ref 19–32)
Calcium: 9.1 mg/dL (ref 8.4–10.5)
Creatinine, Ser: 0.9 mg/dL (ref 0.4–1.5)
GFR: 94.42 mL/min (ref >60.00)
GLUCOSE: 88 mg/dL (ref 70–99)
POTASSIUM: 4.2 meq/L (ref 3.5–5.1)
Sodium: 137 mEq/L (ref 135–145)

## 2013-07-09 LAB — MAGNESIUM: MAGNESIUM: 1.8 mg/dL (ref 1.5–2.5)

## 2013-07-09 LAB — POCT INR: INR: 2.6

## 2013-07-09 MED ORDER — MAGNESIUM OXIDE -MG SUPPLEMENT 250 MG PO TABS: 250.0000 mg | ORAL_TABLET | Freq: Every day | ORAL | Status: DC

## 2013-07-09 NOTE — Telephone Encounter (Signed)
ptcb as instructed about his toprol xl dose. Pt did confirm he is taking toprol xl 12.5 mg daily. I have corrected his medication list today and have advised the PA Tereso Newcomer, of this. Pt was advised per Bing Neighbors. PA to get a BP cuff to monitor BP due to BP's running low, however; pt states he is not going to get a BP cuff, so I advised he could go to [facility], [facility], etc check BP there, keep a diary of readings for a couple of weeks and call us with the readings. Pt said ok and thank you.

## 2013-07-09 NOTE — Telephone Encounter (Signed)
Message copied by Nita Sells on Thu Jul 09, 2013  4:02 PM ------      Message from: Armanda Magic R      Created: Thu Jul 09, 2013  3:40 PM       BMET fine but magnesium slightly low.  Please have him start Magnesium oxide 250mg  daily and recheck magnesium level in 1 week ------

## 2013-07-09 NOTE — Patient Instructions (Addendum)
LAB WORK TODAY; BMET, MAG LEVEL  CHECK MEDS WHEN YOU GET HOME AND IF YOU ARE TAKING  1. TOPROL XL 25 THEN DECREASE TO 12.5 MG DAILY; 2. IF YOU ARE TAKING 50 MG THEN DECREASE TO 25 MG DAILY 3. IF YOU ARE TAKING 12.5 MG THEN NO CHANGES WITH TOPROL XL  WE WILL SEND OUT A REMINDER LETTER TO MAKE AND APPT WITH DR. Mayford Knife 10/2013.  ADDENDUM: ptcb as instructed about his toprol xl dose. Pt did confirm he is taking toprol xl 12.5 mg daily. I have corrected his medication list today and have advised the PA Tereso Newcomer, of this.

## 2013-07-16 ENCOUNTER — Other Ambulatory Visit (INDEPENDENT_AMBULATORY_CARE_PROVIDER_SITE_OTHER): Payer: Managed Care, Other (non HMO)

## 2013-07-16 DIAGNOSIS — Z79899 Other long term (current) drug therapy: Secondary | ICD-10-CM

## 2013-07-16 LAB — MAGNESIUM: Magnesium: 1.9 mg/dL (ref 1.5–2.5)

## 2013-07-16 NOTE — Progress Notes (Signed)
Quick Note:  Preliminary report reviewed by triage nurse and sent to MD desk. ______ 

## 2013-07-17 ENCOUNTER — Telehealth: Payer: Self-pay | Admitting: Cardiology

## 2013-07-17 DIAGNOSIS — Z79899 Other long term (current) drug therapy: Secondary | ICD-10-CM

## 2013-07-17 NOTE — Telephone Encounter (Signed)
Pt is aware and labs ordered and put on lab schedule.

## 2013-07-17 NOTE — Addendum Note (Signed)
Addended by: Nita Sells on: 07/17/2013 04:29 PM   Modules accepted: Orders

## 2013-07-17 NOTE — Telephone Encounter (Signed)
Notes Recorded by Nita Sells, CMA on 07/17/2013 at 2:26 PM Pt has been taking Magnesium Oxide -Mg Supplement 250 MG TABS 1 tablet daily. Pt has put his multivitamin on hold because he saw his centrum for men over 50 had magnesium in it. So he has not taken that in a week but she has been taking his magnesium supplement. Pt wants to know if he should still take both or just take one or the other.    Please have patient take both his Centrum MVI and magnesium oxide and check magnesium level in a week

## 2013-07-22 ENCOUNTER — Other Ambulatory Visit: Payer: Self-pay

## 2013-07-22 MED ORDER — SPIRONOLACTONE 25 MG PO TABS: 12.5000 mg | ORAL_TABLET | Freq: Every day | ORAL | Status: DC

## 2013-07-24 ENCOUNTER — Other Ambulatory Visit (INDEPENDENT_AMBULATORY_CARE_PROVIDER_SITE_OTHER): Payer: Managed Care, Other (non HMO)

## 2013-07-24 DIAGNOSIS — Z79899 Other long term (current) drug therapy: Secondary | ICD-10-CM

## 2013-07-24 LAB — MAGNESIUM: Magnesium: 1.9 mg/dL (ref 1.5–2.5)

## 2013-07-29 ENCOUNTER — Telehealth: Payer: Self-pay | Admitting: Cardiology

## 2013-07-29 DIAGNOSIS — Z79899 Other long term (current) drug therapy: Secondary | ICD-10-CM

## 2013-07-29 MED ORDER — MAGNESIUM OXIDE -MG SUPPLEMENT 250 MG PO TABS: ORAL_TABLET | ORAL | Status: DC

## 2013-07-29 NOTE — Telephone Encounter (Signed)
Pt aware, meds updated and labs ordered.  

## 2013-07-29 NOTE — Telephone Encounter (Signed)
Message copied by Theda Sers on Wed Jul 29, 2013 11:59 AM ------      Message from: Armanda Magic R      Created: Sun Jul 26, 2013  6:47 PM       Magnesium level still below 2.  Please have him increase magnesium oxide to 1 tablet twice daily and repeat in 1 week ------

## 2013-08-04 ENCOUNTER — Other Ambulatory Visit: Payer: Self-pay

## 2013-08-04 MED ORDER — FUROSEMIDE 20 MG PO TABS: 20.0000 mg | ORAL_TABLET | Freq: Every day | ORAL | Status: DC

## 2013-08-05 ENCOUNTER — Other Ambulatory Visit (INDEPENDENT_AMBULATORY_CARE_PROVIDER_SITE_OTHER): Payer: Managed Care, Other (non HMO)

## 2013-08-05 DIAGNOSIS — Z79899 Other long term (current) drug therapy: Secondary | ICD-10-CM

## 2013-08-05 LAB — MAGNESIUM: MAGNESIUM: 1.9 mg/dL (ref 1.5–2.5)

## 2013-08-10 ENCOUNTER — Telehealth: Payer: Self-pay | Admitting: *Deleted

## 2013-08-10 DIAGNOSIS — R790 Abnormal level of blood mineral: Secondary | ICD-10-CM

## 2013-08-10 MED ORDER — MAGNESIUM 250 MG PO TABS: 250.0000 mg | ORAL_TABLET | Freq: Three times a day (TID) | ORAL | Status: DC

## 2013-08-10 NOTE — Telephone Encounter (Signed)
Lab results called to patient with advisement from Dr. Mayford Knife to increase Magnesium Oxide 250 mg from twice daily to three times per day by mouth and to recheck lab work in one week.  Patient verbalized understanding and agreement with treatment plan.

## 2013-08-10 NOTE — Progress Notes (Signed)
Quick Note:  Lab results called to patient with advisement from Dr. Mayford Knife to increase Magnesium Oxide 250 mg from twice daily to three times per day by mouth and to recheck lab work in one week. Patient verbalized understanding and agreement with treatment plan. ______

## 2013-08-19 ENCOUNTER — Ambulatory Visit (INDEPENDENT_AMBULATORY_CARE_PROVIDER_SITE_OTHER): Payer: Managed Care, Other (non HMO) | Admitting: Pharmacist

## 2013-08-19 DIAGNOSIS — I4891 Unspecified atrial fibrillation: Secondary | ICD-10-CM

## 2013-08-19 DIAGNOSIS — Z5181 Encounter for therapeutic drug level monitoring: Secondary | ICD-10-CM

## 2013-08-19 LAB — POCT INR: INR: 4.8

## 2013-08-31 ENCOUNTER — Other Ambulatory Visit: Payer: Self-pay | Admitting: *Deleted

## 2013-08-31 MED ORDER — DOFETILIDE 500 MCG PO CAPS: 500.0000 ug | ORAL_CAPSULE | Freq: Two times a day (BID) | ORAL | Status: DC

## 2013-09-02 ENCOUNTER — Ambulatory Visit (INDEPENDENT_AMBULATORY_CARE_PROVIDER_SITE_OTHER): Payer: Managed Care, Other (non HMO) | Admitting: *Deleted

## 2013-09-02 DIAGNOSIS — I4891 Unspecified atrial fibrillation: Secondary | ICD-10-CM

## 2013-09-02 DIAGNOSIS — Z5181 Encounter for therapeutic drug level monitoring: Secondary | ICD-10-CM

## 2013-09-02 LAB — POCT INR: INR: 4.1

## 2013-09-07 ENCOUNTER — Encounter (HOSPITAL_BASED_OUTPATIENT_CLINIC_OR_DEPARTMENT_OTHER): Payer: Self-pay | Admitting: Emergency Medicine

## 2013-09-07 ENCOUNTER — Emergency Department (HOSPITAL_BASED_OUTPATIENT_CLINIC_OR_DEPARTMENT_OTHER): Payer: Managed Care, Other (non HMO)

## 2013-09-07 ENCOUNTER — Inpatient Hospital Stay (HOSPITAL_BASED_OUTPATIENT_CLINIC_OR_DEPARTMENT_OTHER)
Admission: EM | Admit: 2013-09-07 | Discharge: 2013-09-10 | DRG: 261 | Disposition: A | Discharge status: Home / Self Care | Payer: Managed Care, Other (non HMO) | Attending: Internal Medicine | Admitting: Internal Medicine

## 2013-09-07 ENCOUNTER — Telehealth: Payer: Self-pay | Admitting: Cardiology

## 2013-09-07 DIAGNOSIS — I5042 Chronic combined systolic (congestive) and diastolic (congestive) heart failure: Secondary | ICD-10-CM | POA: Diagnosis present

## 2013-09-07 DIAGNOSIS — S0990XA Unspecified injury of head, initial encounter: Secondary | ICD-10-CM

## 2013-09-07 DIAGNOSIS — J45909 Unspecified asthma, uncomplicated: Secondary | ICD-10-CM | POA: Diagnosis present

## 2013-09-07 DIAGNOSIS — Z7901 Long term (current) use of anticoagulants: Secondary | ICD-10-CM

## 2013-09-07 DIAGNOSIS — I4891 Unspecified atrial fibrillation: Principal | ICD-10-CM | POA: Diagnosis present

## 2013-09-07 DIAGNOSIS — I429 Cardiomyopathy, unspecified: Secondary | ICD-10-CM

## 2013-09-07 DIAGNOSIS — R7309 Other abnormal glucose: Secondary | ICD-10-CM | POA: Diagnosis present

## 2013-09-07 DIAGNOSIS — K769 Liver disease, unspecified: Secondary | ICD-10-CM | POA: Diagnosis present

## 2013-09-07 DIAGNOSIS — I5022 Chronic systolic (congestive) heart failure: Secondary | ICD-10-CM | POA: Diagnosis present

## 2013-09-07 DIAGNOSIS — R55 Syncope and collapse: Secondary | ICD-10-CM | POA: Diagnosis present

## 2013-09-07 DIAGNOSIS — I77810 Thoracic aortic ectasia: Secondary | ICD-10-CM

## 2013-09-07 DIAGNOSIS — G4733 Obstructive sleep apnea (adult) (pediatric): Secondary | ICD-10-CM | POA: Diagnosis present

## 2013-09-07 DIAGNOSIS — W19XXXA Unspecified fall, initial encounter: Secondary | ICD-10-CM | POA: Diagnosis present

## 2013-09-07 DIAGNOSIS — D689 Coagulation defect, unspecified: Secondary | ICD-10-CM

## 2013-09-07 DIAGNOSIS — D693 Immune thrombocytopenic purpura: Secondary | ICD-10-CM | POA: Diagnosis present

## 2013-09-07 DIAGNOSIS — I77819 Aortic ectasia, unspecified site: Secondary | ICD-10-CM | POA: Diagnosis present

## 2013-09-07 DIAGNOSIS — R001 Bradycardia, unspecified: Secondary | ICD-10-CM | POA: Diagnosis present

## 2013-09-07 DIAGNOSIS — I428 Other cardiomyopathies: Secondary | ICD-10-CM | POA: Diagnosis present

## 2013-09-07 DIAGNOSIS — Z87891 Personal history of nicotine dependence: Secondary | ICD-10-CM

## 2013-09-07 HISTORY — DX: Bradycardia, unspecified: R00.1

## 2013-09-07 HISTORY — DX: Syncope and collapse: R55

## 2013-09-07 HISTORY — DX: Paroxysmal atrial fibrillation: I48.0

## 2013-09-07 LAB — URINALYSIS, ROUTINE W REFLEX MICROSCOPIC
BILIRUBIN URINE: NEGATIVE
GLUCOSE, UA: NEGATIVE mg/dL
HGB URINE DIPSTICK: NEGATIVE
Ketones, ur: NEGATIVE mg/dL
LEUKOCYTES UA: NEGATIVE
Nitrite: NEGATIVE
PH: 7 (ref 5.0–8.0)
PROTEIN: NEGATIVE mg/dL
Specific Gravity, Urine: 1.025 (ref 1.005–1.030)
Urobilinogen, UA: 1 mg/dL (ref 0.0–1.0)

## 2013-09-07 LAB — TROPONIN I: Troponin I: <0.3 ng/mL (ref <0.30)

## 2013-09-07 LAB — CBC WITH DIFFERENTIAL/PLATELET
Basophils Absolute: 0 10*3/uL (ref 0.0–0.1)
Basophils Relative: 0 % (ref 0–1)
EOS PCT: 1 % (ref 0–5)
Eosinophils Absolute: 0.1 10*3/uL (ref 0.0–0.7)
HEMATOCRIT: 43 % (ref 39.0–52.0)
Hemoglobin: 15.5 g/dL (ref 13.0–17.0)
LYMPHS ABS: 1.3 10*3/uL (ref 0.7–4.0)
LYMPHS PCT: 15 % (ref 12–46)
MCH: 33.9 pg (ref 26.0–34.0)
MCHC: 36 g/dL (ref 30.0–36.0)
MCV: 94.1 fL (ref 78.0–100.0)
Monocytes Absolute: 0.6 10*3/uL (ref 0.1–1.0)
Monocytes Relative: 6 % (ref 3–12)
NEUTROS ABS: 6.9 10*3/uL (ref 1.7–7.7)
Neutrophils Relative %: 78 % — ABNORMAL HIGH (ref 43–77)
PLATELETS: 133 10*3/uL — AB (ref 150–400)
RBC: 4.57 MIL/uL (ref 4.22–5.81)
RDW: 15.5 % (ref 11.5–15.5)
WBC: 8.9 10*3/uL (ref 4.0–10.5)

## 2013-09-07 LAB — COMPREHENSIVE METABOLIC PANEL
ALT: 18 U/L (ref 0–53)
AST: 26 U/L (ref 0–37)
Albumin: 4.2 g/dL (ref 3.5–5.2)
Alkaline Phosphatase: 93 U/L (ref 39–117)
BILIRUBIN TOTAL: 3 mg/dL — AB (ref 0.3–1.2)
BUN: 18 mg/dL (ref 6–23)
CHLORIDE: 98 meq/L (ref 96–112)
CO2: 28 meq/L (ref 19–32)
Calcium: 9.3 mg/dL (ref 8.4–10.5)
Creatinine, Ser: 0.9 mg/dL (ref 0.50–1.35)
GFR calc Af Amer: >90 mL/min (ref >90)
GFR calc non Af Amer: 84 mL/min — ABNORMAL LOW (ref >90)
Glucose, Bld: 156 mg/dL — ABNORMAL HIGH (ref 70–99)
Potassium: 4.2 mEq/L (ref 3.7–5.3)
SODIUM: 139 meq/L (ref 137–147)
Total Protein: 6.9 g/dL (ref 6.0–8.3)

## 2013-09-07 LAB — PROTIME-INR
INR: 3.39 — ABNORMAL HIGH (ref 0.00–1.49)
Prothrombin Time: 33 seconds — ABNORMAL HIGH (ref 11.6–15.2)

## 2013-09-07 MED ORDER — IOHEXOL 300 MG/ML  SOLN
100.0000 mL | Freq: Once | INTRAMUSCULAR | Status: AC | PRN
  Administered 2013-09-07: 100 mL via INTRAVENOUS

## 2013-09-07 MED ORDER — ONDANSETRON HCL 4 MG/2ML IJ SOLN: 4.0000 mg | Freq: Three times a day (TID) | INTRAMUSCULAR | Status: AC | PRN

## 2013-09-07 MED ORDER — SODIUM CHLORIDE 0.9 % IV SOLN
INTRAVENOUS | Status: AC
  Administered 2013-09-07: via INTRAVENOUS

## 2013-09-07 MED ORDER — TETANUS-DIPHTH-ACELL PERTUSSIS 5-2.5-18.5 LF-MCG/0.5 IM SUSP
0.5000 mL | Freq: Once | INTRAMUSCULAR | Status: AC
  Administered 2013-09-07: 0.5 mL via INTRAMUSCULAR
  Filled 2013-09-07: qty 0.5

## 2013-09-07 NOTE — ED Provider Notes (Signed)
CSN: [id number]     Arrival date & time 09/07/13  2007 History  This chart was scribed for Glynn Octave, MD by Danella Maiers, ED Scribe. This patient was seen in room MH01/MH01 and the patient's care was started at 8:45 PM.    Chief Complaint  Patient presents with  . Loss of Consciousness  . Fall   The history is provided by the patient and the spouse. No language interpreter was used.   HPI Comments: Joseph Allison is a 73 y.o. male with a h/o Afib who presents to the Emergency Department complaining of one episode of sudden-onset syncope that occurred this morning around 7am while he was in the shower. Pt states he remembers being in the shower and the next thing he knew he woke up on the floor. He states he thinks he hit the back of his head on the toilet. He denies feeling dizzy or lightheaded before losing consciousness. Wife states she heard the crash and ran to the bathroom and he was awake on the floor. He was able to get up on his own. He denies CP, SOB, vomiting. He is on coumadin. He ate and drank normally today. He reports another fall earlier this week while getting out of the car. He denies h/o falls before this week.  Past Medical History  Diagnosis Date  . Thrombocytopenia   . Atrial fibrillation     failed DCCV 08/2010; Tikoysn load 12/2010  . Asthma   . Sleep apnea   . NICM (nonischemic cardiomyopathy)     tachycardia mediated;  CLite (08/2010): no ischemia; EF 32%; Echo (11/2010):  EF 45%, mod BAE, mild AI, mild MR, mild to mod TR  . OSA (obstructive sleep apnea)   . Inguinal hernia    Past Surgical History  Procedure Laterality Date  . Cholecystectomy     No family history on file. History  Substance Use Topics  . Smoking status: Former Smoker    Quit date: 04/24/1983  . Smokeless tobacco: Never Used  . Alcohol Use: No    Review of Systems  Respiratory: Negative for shortness of breath.   Cardiovascular: Negative for chest pain.  Gastrointestinal:  Negative for vomiting.  Neurological: Positive for syncope. Negative for dizziness and light-headedness.   A complete 10 system review of systems was obtained and all systems are negative except as noted in the HPI and PMH.     Allergies  Review of patient's allergies indicates no known allergies.  Home Medications   No current outpatient prescriptions on file. BP 119/59  Pulse 59  Temp(Src) 98 F (36.7 C) (Oral)  Resp 20  Ht 5\' 11"  (1.803 m)  Wt 183 lb (83.008 kg)  BMI 25.53 kg/m2  SpO2 100% Physical Exam  Nursing note and vitals reviewed. Constitutional: He is oriented to person, place, and time. He appears well-developed and well-nourished. No distress.  HENT:  Head: Normocephalic and atraumatic.  Eyes: EOM are normal.  Neck: Neck supple. No tracheal deviation present.  Cardiovascular: Normal rate and regular rhythm.   No murmur heard. Pulmonary/Chest: Effort normal. No respiratory distress.  Abdominal: Soft. There is no tenderness.  Musculoskeletal: Normal range of motion.  Abrasion to his right occipital scalp. No C-spine tenderness.  Neurological: He is alert and oriented to person, place, and time.  CN 2-12 intact, no ataxia on finger to nose, no nystagmus, 5/5 strength throughout, no pronator drift, Romberg negative, normal gait.   Skin: Skin is warm and dry.  Old  appearing ecchymosis to abdomen. Ecchymosis to bilateral knees and proximal shins.   Psychiatric: He has a normal mood and affect. His behavior is normal.    ED Course  Procedures (including critical care time) Medications  0.9 %  sodium chloride infusion ( Intravenous New Bag/Given 09/07/13 2349)  ondansetron (ZOFRAN) injection 4 mg (not administered)  furosemide (LASIX) tablet 20 mg (not administered)  dofetilide (TIKOSYN) capsule 500 mcg (not administered)  magnesium oxide (MAG-OX) tablet 400 mg (not administered)  spironolactone (ALDACTONE) tablet 12.5 mg (not administered)  ramipril (ALTACE)  capsule 5 mg (not administered)  potassium chloride SA (K-DUR,KLOR-CON) CR tablet 40 mEq (not administered)  sodium chloride 0.9 % injection 3 mL (not administered)  Tdap (BOOSTRIX) injection 0.5 mL (0.5 mLs Intramuscular Given 09/07/13 2124)  iohexol (OMNIPAQUE) 300 MG/ML solution 100 mL (100 mLs Intravenous Contrast Given 09/07/13 2233)    DIAGNOSTIC STUDIES: Oxygen Saturation is 100% on RA, normal by my interpretation.    COORDINATION OF CARE: 8:51 PM- Discussed treatment plan with pt. Pt agrees to plan.    Labs Review Labs Reviewed  CBC WITH DIFFERENTIAL - Abnormal; Notable for the following:    Platelets 133 (*)    Neutrophils Relative % 78 (*)    All other components within normal limits  COMPREHENSIVE METABOLIC PANEL - Abnormal; Notable for the following:    Glucose, Bld 156 (*)    Total Bilirubin 3.0 (*)    GFR calc non Af Amer 84 (*)    All other components within normal limits  PROTIME-INR - Abnormal; Notable for the following:    Prothrombin Time 33.0 (*)    INR 3.39 (*)    All other components within normal limits  URINALYSIS, ROUTINE W REFLEX MICROSCOPIC - Abnormal; Notable for the following:    APPearance CLOUDY (*)    All other components within normal limits  TROPONIN I   Imaging Review Dg Chest 2 View  09/07/2013   CLINICAL DATA:  Syncope.  Right anterior chest pain.  EXAM: CHEST  2 VIEW  COMPARISON:  05/17/2009  FINDINGS: Mild interstitial coarsening which is chronic. No edema or consolidation. No effusion or pneumothorax. Minimal opacity at the peripheral left base is likely scarring or atelectasis. This will be seen on abdominal CT which has already been ordered. Remote upper thoracic compression fracture.  IMPRESSION: 1. No edema or consolidation. 2. Minimal atelectasis or scar at the left base.   Electronically Signed   By: Tiburcio Pea M.D.   On: 09/07/2013 22:36   Ct Head Wo Contrast  09/07/2013   CLINICAL DATA:  Fall, loss of consciousness.  EXAM: CT  HEAD WITHOUT CONTRAST  CT CERVICAL SPINE WITHOUT CONTRAST  TECHNIQUE: Multidetector CT imaging of the head and cervical spine was performed following the standard protocol without intravenous contrast. Multiplanar CT image reconstructions of the cervical spine were also generated.  COMPARISON:  CT of the sinus report dated December 09, 2001 though images are not available for direct comparison.  FINDINGS: CT HEAD FINDINGS  The ventricles and sulci are normal for age. No intraparenchymal hemorrhage, mass effect nor midline shift. Patchy supratentorial white matter hypodensities are within normal range for patient's age and though non-specific suggest sequelae of chronic small vessel ischemic disease. No acute large vascular territory infarcts.  No abnormal extra-axial fluid collections. Cystic 5.5 x 4.5 cm extra-axial right middle cranial fossa mass insinuating along the sylvian fissure most consistent with arachnoid cyst. Basal cisterns are patent. Moderate calcific atherosclerosis of the carotid  siphons.  No skull fracture. Visualized paranasal sinuses and mastoid aircells are well-aerated. The included ocular globes and orbital contents are non-suspicious. Soft tissue within the right external auditory canal may reflect cerumen.  CT CERVICAL SPINE FINDINGS  Cervical vertebral bodies and posterior elements are intact. Grade 1 C3-4 retrolisthesis on degenerative basis. Moderate to severe C3-4 and moderate C5-6 degenerative disc disease, mild at C4-5. No destructive bony lesions. Severe right C1-2 arthropathy. No destructive bony lesions. Included prevertebral and paraspinal soft tissues are nonsuspicious.  Degenerative disc disease and facet arthropathy result in moderate canal stenosis at C3-4. Moderate C3-4 and mild to moderate C5-6 neural foraminal narrowing.  IMPRESSION: CT head:  No acute intracranial process.  Right middle cranial fossa arachnoid cyst. Involutional changes. Mild white matter changes suggest chronic  small vessel ischemic disease.  CT cervical spine: No acute fracture. Grade 1 C3-4 retrolisthesis on degenerative basis.   Electronically Signed   By: Courtnay  Bloomer   On: 09/07/2013 22:52   Ct Cervical Spine Wo Contrast  09/07/2013   CLINICAL DATA:  Fall, loss of consciousness.  EXAM: CT HEAD WITHOUT CONTRAST  CT CERVICAL SPINE WITHOUT CONTRAST  TECHNIQUE: Multidetector CT imaging of the head and cervical spine was performed following the standard protocol without intravenous contrast. Multiplanar CT image reconstructions of the cervical spine were also generated.  COMPARISON:  CT of the sinus report dated December 09, 2001 though images are not available for direct comparison.  FINDINGS: CT HEAD FINDINGS  The ventricles and sulci are normal for age. No intraparenchymal hemorrhage, mass effect nor midline shift. Patchy supratentorial white matter hypodensities are within normal range for patient's age and though non-specific suggest sequelae of chronic small vessel ischemic disease. No acute large vascular territory infarcts.  No abnormal extra-axial fluid collections. Cystic 5.5 x 4.5 cm extra-axial right middle cranial fossa mass insinuating along the sylvian fissure most consistent with arachnoid cyst. Basal cisterns are patent. Moderate calcific atherosclerosis of the carotid siphons.  No skull fracture. Visualized paranasal sinuses and mastoid aircells are well-aerated. The included ocular globes and orbital contents are non-suspicious. Soft tissue within the right external auditory canal may reflect cerumen.  CT CERVICAL SPINE FINDINGS  Cervical vertebral bodies and posterior elements are intact. Grade 1 C3-4 retrolisthesis on degenerative basis. Moderate to severe C3-4 and moderate C5-6 degenerative disc disease, mild at C4-5. No destructive bony lesions. Severe right C1-2 arthropathy. No destructive bony lesions. Included prevertebral and paraspinal soft tissues are nonsuspicious.  Degenerative disc  disease and facet arthropathy result in moderate canal stenosis at C3-4. Moderate C3-4 and mild to moderate C5-6 neural foraminal narrowing.  IMPRESSION: CT head:  No acute intracranial process.  Right middle cranial fossa arachnoid cyst. Involutional changes. Mild white matter changes suggest chronic small vessel ischemic disease.  CT cervical spine: No acute fracture. Grade 1 C3-4 retrolisthesis on degenerative basis.   Electronically Signed   By: Courtnay  Bloomer   On: 09/07/2013 22:52   Ct Abdomen Pelvis W Contrast  09/07/2013   CLINICAL DATA:  Fall, bruising to right abdomen  EXAM: CT ABDOMEN AND PELVIS WITH CONTRAST  TECHNIQUE: Multidetector CT imaging of the abdomen and pelvis was performed using the standard protocol following bolus administration of intravenous contrast.  CONTRAST:  <MEASUREM<MEASUREMEN T> OMNIPAQUE IOHEXOL 300 MG/ML  SOLN  COMPARISON:  None.  FINDINGS: Lower Chest: Mild dependent atelectasis in the lower lobes. Visualized cardiac structures within normal limits for size. No pericardial effusion. Unremarkable visualized distal thoracic esophagus.  Abdomen: Unremarkable  CT appearance of the stomach, duodenum, adrenal glands and pancreas. 4 mm circumscribed hypoattenuating lesion in the hepatic dome is too small to characterize but statistically likely a benign cyst. Nonspecific 5 mm low-attenuation lesion in hepatic segment 2. This is also too small to accurately characterize. Patient is status post cholecystectomy. No intra or extrahepatic biliary ductal dilatation. Borderline splenomegaly. The spleen likely remains within normal limits. No focal lesion. Unremarkable appearance of the bilateral kidneys. No focal solid lesion, hydronephrosis or nephrolithiasis.  Redundant sigmoid colon. Moderate volume of colonic stool suggests underlying constipation. Normal appendix in the right lower quadrant. Unremarkable terminal ileum. No bowel wall thickening or evidence of obstruction. Right inguinal hernia  containing loops of unremarkable distal ileum. No evidence of hemo peritoneum or solid organ injury.  Pelvis: Unremarkable bladder, prostate gland and seminal vesicles. No free fluid or suspicious adenopathy.  Bones/Soft Tissues: No acute fracture or aggressive appearing lytic or blastic osseous lesion. Multilevel degenerative disc disease in the lumbar spine most significant at L5-S1.  Vascular: No significant atherosclerotic vascular disease, aneurysmal dilatation or acute abnormality.  IMPRESSION: 1. No evidence of acute injury to the abdomen or pelvis. 2. Moderately large right inguinal hernia containing loops of unremarkable distal ileum. 3. Sub cm low-attenuation hepatic lesions. If the patient has no known history of primary malignancy, these are statistically highly likely to represent benign cysts. If there is a history of prior malignancy and metastatic disease is a consideration, recommend further evaluation with MRI of the abdomen with and without contrast in 3 months. 4. Multilevel degenerative disc disease most significant at L5-S1.   Electronically Signed   By: Malachy MoanHeath  McCullough M.D.   On: 09/07/2013 23:15   Dg Knee Complete 4 Views Left  09/07/2013   CLINICAL DATA:  Fall with knee pain.  EXAM: LEFT KNEE - COMPLETE 4+ VIEW  COMPARISON:  None.  FINDINGS: No evidence of acute fracture, joint effusion, or malalignment. Tricompartmental osteoarthritis with marginal spurs and mild medial compartment narrowing. Irregularity of the fibular neck ventral cortex is likely chronic/ incidental.  IMPRESSION: No acute osseous abnormality.   Electronically Signed   By: Tiburcio PeaJonathan  Watts M.D.   On: 09/07/2013 22:39   Dg Knee Complete 4 Views Right  09/07/2013   CLINICAL DATA:  Fall.  Knee pain.  EXAM: RIGHT KNEE - COMPLETE 4+ VIEW  COMPARISON:  None.  FINDINGS: No acute fracture or malalignment. No significant joint effusion. There is tricompartmental osteoarthritis with marginal spurring and moderate medial  compartment narrowing.  IMPRESSION: 1. No acute osseous findings. 2. Tricompartmental osteoarthritis.   Electronically Signed   By: Tiburcio PeaJonathan  Watts M.D.   On: 09/07/2013 22:38     EKG Interpretation None      MDM   Final diagnoses:  Syncope  Head injury  Coagulopathy   patient with syncopal episode in the shower this morning without prodrome. Hit head on the toilet. Positive loss of consciousness. He is on Coumadin. Denies any chest pain or shortness of breath. Has multiple bruises to his abdomen and bilateral knees which she says are from other falls.  INR 3.3. EKG unchanged. CT head negative for hemorrhage. CT c spine negative. CT abdomen without acute traumatic pathology Knee xrays negative.  Concerned that patient has syncope without prodrome. No evidence of intracranial pathology. No arrhythmia on EKG. Patient with history of nonischemic cardiomyopathy, atrial fibrillation. He will be admitted for further evaluation of his syncope as it appears to be cardiogenic in etiology.    Date:  09/07/2013  Rate: 55  Rhythm: sinus bradycardia  QRS Axis: normal  Intervals: normal  ST/T Wave abnormalities: normal  Conduction Disutrbances:none  Narrative Interpretation:   Old EKG Reviewed: unchanged   I personally performed the services described in this documentation, which was scribed in my presence. The recorded information has been reviewed and is accurate.   Glynn Octave, MD 09/08/13 [phone]

## 2013-09-07 NOTE — Telephone Encounter (Signed)
New message    Wife calling stating  patient  fell out of shower this am . Wife stated when she came in the room that patient stated she  woke him up. Patient does not remember how he fell .    Wife stated she does not know what to do . Should medication be adjustment.

## 2013-09-07 NOTE — Telephone Encounter (Signed)
LVM for pt's wife to return call 

## 2013-09-07 NOTE — Telephone Encounter (Signed)
Please have patient go to ER for evaluation.  He has a history of DCM, afib on Tikosyn and bradycardia

## 2013-09-07 NOTE — Telephone Encounter (Signed)
Pts wife is aware 

## 2013-09-07 NOTE — ED Notes (Signed)
Syncope this am. He passed out in the shower. Soreness to his right ribs. States he fell forward hitting the commode lid and broke it.

## 2013-09-07 NOTE — Telephone Encounter (Signed)
To Dr Turner to advise 

## 2013-09-07 NOTE — ED Notes (Signed)
Patient transported to X-ray 

## 2013-09-07 NOTE — Progress Notes (Signed)
Transfer request from Dr. Manus Gunning, [facility]. Pt is 73 yo male who presented after syncopal episode in the shower, hit the head and now with abrasion on his head. He is on Coumadin for atrial fibrillation and sees dr. Armanda Magic cardiology. He is in NSR in the ED. Telemetry bed requested.   Debbora Presto, MD  [facility] Pager [phone] Cell [phone]  If 7PM-7AM, please contact night-coverage [email or web address] Password [email or web address]

## 2013-09-08 ENCOUNTER — Encounter (HOSPITAL_COMMUNITY): Payer: Self-pay | Admitting: *Deleted

## 2013-09-08 DIAGNOSIS — I77810 Thoracic aortic ectasia: Secondary | ICD-10-CM

## 2013-09-08 DIAGNOSIS — I4891 Unspecified atrial fibrillation: Principal | ICD-10-CM

## 2013-09-08 DIAGNOSIS — K769 Liver disease, unspecified: Secondary | ICD-10-CM | POA: Diagnosis present

## 2013-09-08 DIAGNOSIS — R55 Syncope and collapse: Secondary | ICD-10-CM

## 2013-09-08 DIAGNOSIS — I059 Rheumatic mitral valve disease, unspecified: Secondary | ICD-10-CM

## 2013-09-08 DIAGNOSIS — I5022 Chronic systolic (congestive) heart failure: Secondary | ICD-10-CM

## 2013-09-08 DIAGNOSIS — I428 Other cardiomyopathies: Secondary | ICD-10-CM

## 2013-09-08 DIAGNOSIS — J45909 Unspecified asthma, uncomplicated: Secondary | ICD-10-CM

## 2013-09-08 DIAGNOSIS — R001 Bradycardia, unspecified: Secondary | ICD-10-CM | POA: Diagnosis present

## 2013-09-08 LAB — TROPONIN I

## 2013-09-08 LAB — MAGNESIUM: Magnesium: 1.9 mg/dL (ref 1.5–2.5)

## 2013-09-08 LAB — T4, FREE: Free T4: 1.27 ng/dL (ref 0.80–1.80)

## 2013-09-08 LAB — TSH: TSH: 2.85 u[IU]/mL (ref 0.350–4.500)

## 2013-09-08 MED ORDER — DOFETILIDE 500 MCG PO CAPS
500.0000 ug | ORAL_CAPSULE | Freq: Two times a day (BID) | ORAL | Status: DC
  Administered 2013-09-08: 500 ug via ORAL
  Filled 2013-09-08 (×3): qty 1

## 2013-09-08 MED ORDER — DOFETILIDE 500 MCG PO CAPS
500.0000 ug | ORAL_CAPSULE | Freq: Two times a day (BID) | ORAL | Status: DC
  Administered 2013-09-08 – 2013-09-10 (×4): 500 ug via ORAL
  Filled 2013-09-08 (×6): qty 1

## 2013-09-08 MED ORDER — POTASSIUM CHLORIDE CRYS ER 20 MEQ PO TBCR
40.0000 meq | EXTENDED_RELEASE_TABLET | Freq: Every day | ORAL | Status: DC
  Administered 2013-09-08 – 2013-09-10 (×3): 40 meq via ORAL
  Filled 2013-09-08 (×3): qty 2

## 2013-09-08 MED ORDER — DOFETILIDE 500 MCG PO CAPS
500.0000 ug | ORAL_CAPSULE | Freq: Two times a day (BID) | ORAL | Status: DC
  Filled 2013-09-08 (×2): qty 1

## 2013-09-08 MED ORDER — FUROSEMIDE 20 MG PO TABS
20.0000 mg | ORAL_TABLET | Freq: Every day | ORAL | Status: DC
  Administered 2013-09-08 – 2013-09-10 (×3): 20 mg via ORAL
  Filled 2013-09-08 (×3): qty 1

## 2013-09-08 MED ORDER — SODIUM CHLORIDE 0.9 % IJ SOLN
3.0000 mL | Freq: Two times a day (BID) | INTRAMUSCULAR | Status: DC
  Administered 2013-09-08 – 2013-09-10 (×5): 3 mL via INTRAVENOUS

## 2013-09-08 MED ORDER — RAMIPRIL 5 MG PO CAPS
5.0000 mg | ORAL_CAPSULE | Freq: Every day | ORAL | Status: DC
  Administered 2013-09-08 – 2013-09-10 (×3): 5 mg via ORAL
  Filled 2013-09-08 (×4): qty 1

## 2013-09-08 MED ORDER — METOPROLOL SUCCINATE 12.5 MG HALF TABLET: 12.5000 mg | ORAL_TABLET | Freq: Every day | ORAL | Status: DC

## 2013-09-08 MED ORDER — MAGNESIUM OXIDE 400 (241.3 MG) MG PO TABS
400.0000 mg | ORAL_TABLET | Freq: Three times a day (TID) | ORAL | Status: DC
  Administered 2013-09-08 – 2013-09-10 (×7): 400 mg via ORAL
  Filled 2013-09-08 (×9): qty 1

## 2013-09-08 MED ORDER — SPIRONOLACTONE 12.5 MG HALF TABLET
12.5000 mg | ORAL_TABLET | Freq: Every day | ORAL | Status: DC
  Administered 2013-09-08 – 2013-09-10 (×3): 12.5 mg via ORAL
  Filled 2013-09-08 (×3): qty 1

## 2013-09-08 NOTE — Progress Notes (Signed)
UR Completed Deatrice Spanbauer Graves-Bigelow, RN,BSN [phone]  

## 2013-09-08 NOTE — Progress Notes (Signed)
Pt states that he took his tikosyn last night around 8pm, pt then received another dose this morning around 230am; pts QTc 0.38; spoke with Dr.Hochrein and said he did not want pt to received more than in a 24hr period; pt made aware, 12pm dose not given

## 2013-09-08 NOTE — Care Management Note (Signed)
    Page 1 of 1   09/08/2013     3:09:41 PM   CARE MANAGEMENT NOTE 09/08/2013  Patient:  Joseph Allison, Joseph Allison   Account Number:  [id number]  Date Initiated:  09/08/2013  Documentation initiated by:  GRAVES-BIGELOW,Adisson Deak  Subjective/Objective Assessment:   Pt admitted for syncope and fall while in the shower. Cardiology to f/u.     Action/Plan:   CM will continue to monitor for disposition needs.   Anticipated DC Date:  09/10/2013   Anticipated DC Plan:  HOME/SELF CARE      DC Planning Services  CM consult      Choice offered to / List presented to:             Status of service:  In process, will continue to follow Medicare Important Message given?   (If response is "NO", the following Medicare IM given date fields will be blank) Date Medicare IM given:   Date Additional Medicare IM given:    Discharge Disposition:    Per UR Regulation:  Reviewed for med. necessity/level of care/duration of stay  If discussed at Long Length of Stay Meetings, dates discussed:    Comments:

## 2013-09-08 NOTE — Progress Notes (Signed)
  Echocardiogram 2D Echocardiogram has been performed.  Ameenah Prosser FRANCES 09/08/2013, 2:22 PM

## 2013-09-08 NOTE — Progress Notes (Signed)
ANTICOAGULATION CONSULT NOTE - Initial Consult  Pharmacy Consult for Coumadin Indication: atrial fibrillation  No Known Allergies  Patient Measurements: Height: 5\' 11"  (180.3 cm) Weight: 177 lb 1.6 oz (80.332 kg) IBW/kg (Calculated) : 75.3  Vital Signs: Temp: 97.8 F (36.6 C) (03/31 0145) Temp src: Oral (03/31 0145) BP: 102/54 mmHg (03/31 0145) Pulse Rate: 51 (03/31 0145)  Labs:  Recent Labs  09/07/13 2108  HGB 15.5  HCT 43.0  PLT 133*  LABPROT 33.0*  INR 3.39*  CREATININE 0.90  TROPONINI <0.30    Estimated Creatinine Clearance: 80.2 ml/min (by C-G formula based on Cr of 0.9).   Medical History: Past Medical History  Diagnosis Date  . Thrombocytopenia   . Atrial fibrillation     failed DCCV 08/2010; Tikoysn load 12/2010  . Asthma   . Sleep apnea   . NICM (nonischemic cardiomyopathy)     tachycardia mediated;  CLite (08/2010): no ischemia; EF 32%; Echo (11/2010):  EF 45%, mod BAE, mild AI, mild MR, mild to mod TR  . OSA (obstructive sleep apnea)   . Inguinal hernia     Medications:  Prescriptions prior to admission  Medication Sig Dispense Refill  . dofetilide (TIKOSYN) 500 MCG capsule Take 1 capsule (500 mcg total) by mouth 2 (two) times daily.  180 capsule  0  . furosemide (LASIX) 20 MG tablet Take 1 tablet (20 mg total) by mouth daily.  30 tablet  9  . glucosamine-chondroitin 500-400 MG tablet Take 2 tablets by mouth at bedtime.       . Magnesium 250 MG TABS Take 1 tablet (250 mg total) by mouth 3 (three) times daily.  270 tablet  2  . Magnesium Oxide -Mg Supplement 250 MG TABS 1 tablet by mouth twice a day  30 tablet  5  . metoprolol succinate (TOPROL-XL) 25 MG 24 hr tablet Take 12.5 mg by mouth daily.       . Multiple Vitamins-Minerals (CENTRUM SILVER PO) Take 1 tablet by mouth daily.        . potassium chloride SA (K-DUR,KLOR-CON) 20 MEQ tablet Take 40 mEq by mouth daily.       . ramipril (ALTACE) 5 MG capsule TAKE ONE CAPSULE BY MOUTH EVERY DAY  90  capsule  1  . spironolactone (ALDACTONE) 25 MG tablet Take 0.5 tablets (12.5 mg total) by mouth daily.  45 tablet  3  . warfarin (COUMADIN) 1 MG tablet Take 1 mg by mouth daily. Takes 2 mg on Monday and Friday all other days takes 1 mg.       Scheduled:  . sodium chloride   Intravenous STAT  . dofetilide  500 mcg Oral BID  . furosemide  20 mg Oral Daily  . magnesium oxide  400 mg Oral TID  . metoprolol succinate  12.5 mg Oral Daily  . potassium chloride SA  40 mEq Oral Daily  . ramipril  5 mg Oral Daily  . spironolactone  12.5 mg Oral Daily    Assessment: 73yo male admitted for evaluation after syncopal episode in shower, hit head though CT negative for bleed, to continue Coumadin for Afib; admitted with supratherapeutic INR after dose adjustment on 3/25 for high INR in office.  Goal of Therapy:  INR 2-3   Plan:  Will monitor INR for appropriate resumption and adjustment of Coumadin.  Vernard Gambles, PharmD, BCPS  09/08/2013,1:58 AM

## 2013-09-08 NOTE — Progress Notes (Signed)
Pt ambulated 315ft up in hallway; pt without any complaints, no changes noted on tele

## 2013-09-08 NOTE — Consult Note (Signed)
Cardiology Consultation Note  Patient ID: Joseph Allison, MRN: [id number], DOB/AGE: 1941-06-11 73 y.o. 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 He has been doing well in his usual state of health. He took a shower yesterday morning. Near the end of the shower, he promptly passed out. He is amnestic to any preceding events - one minute he was standing in the shower, the next he fell out of the tub stall, broke the toilet seat, and hit his head. His wife heard him fall and rushed into the room. By that time he was already coming to. They do not recall if he was confused at first because she was so concerned to make sure he was OK, but he was able to get up fairly quickly and continue on with normal activites. No b/b incontinence, seizure activity, slurred speech, post-syncope chest pain, SOB, nausea, visual changes. He got dressed and ate lunch, then went and sat on the couch. His wife noticed he nodded his head down and fell asleep, which he does usually after dinner while watching TV. He doesn't usually do this in the morning however. She called his name and he awoke right away. She suggested he seek care, but he preferred to go on with regular activities. He says he felt "fine" for the rest of the day, but just didn't feel as much energy. His wife noticed for a brief period of time in the afternoon he was panting slightly, which she says is typically what he does when he is in afib. He denied any palpitations but says he does this on rare occasions to a "very  mild" degree. There was no associated presyncope at that time. He fixed the toilet seat then went to work at [employer]. When he got home, his wife called our office and he was advised to go to the ER for evaluation. He initially went to [facility] then was transferred to [facility]. He denies any recent exertional CP or SOB. No prior presyncope or syncope (except while he was watching his daughter be born many years ago). Orthostatic VS were negative although BP on softer side - BPs: 103/60->102/59->115/77; HRs: 52->52->61. Labwork grossy unremarkable except glu 156, chronic thrombocytopenia, troponin neg x 1. INR is 3.39. EKG shows sinus bradycardia with TWI avL, QTC ranging 434-446 and no acute changes. CT abd pelvis shows moderately large right inguinal hernia, low attenuation hepatic lesions (?benign cysts versus additional workup needed), DDD. CT head nonacute. C-spine no acute fx. CXR without edema or consolidation. 2D echo is described below - EF 60-65%, no RMWA, trivial AI, Aortic root 42mm, mild MR, mildly dilated LA/RA, normal CVP.  Spoke with charge nurse because there are 4  distinct times at which telemetry was being recorded under this patient. The patient arrived to [facility] around 8:30pm. He was likely off telemetry for a period of time for his various scans. He arrived to his room around 2am. Initial EKG at 8:40pm was sinus bradycardia. On telemetry, there are blocks of time around 9pm, 11pm and after 12am where there is evidence of atrial fib  with rates in the 110-120s. From 2am on, when the patient arrived to the floor, he has been in sinus bradycardia with a HR in the upper 40s-60s. He does not any recall symptoms in the ER. We spoke with central telemetry who do feel that all 4 were recorded from the patient. (The patient in this room the day before had been discharged at 3pm.)   Past Medical History  Diagnosis Date  . Thrombocytopenia     a. Followed by Dr. Myna Hidalgo - chronic  immune thrombocytopenia.   Marland Kitchen PAF (paroxysmal atrial fibrillation)     a. S/p prior DCCVs. b. Failed DCCV 08/2010 - started on Tikosyn 12/2010.  . Asthma   . NICM (nonischemic cardiomyopathy)     a. Presumed tachycardia mediated EF 45% in 11/2010. b. Nuc stress test 06/2010: no ischemia. One note suggests the patient had a nuc 08/2010 with no ischemia & EF 32% but cannot find evidence of this.  . OSA (obstructive sleep apnea)   . Inguinal hernia   . Sinus bradycardia       Most Recent Cardiac Studies: Nuclear Stress Test 06/2010 Normal myocardial perfusion study. No evidence for ischemia.   2D Echo 10/2010 Mildly reduce LV function. EF 45%. Mod LAE, mild MR, mild AI, mild-mod TR, mod RAE.  Echo this admission - - Left ventricle: The cavity size was normal. Wall thickness was normal. Systolic function was normal. The estimated ejection fraction was in the range of 60% to 65%. Wall motion was normal; there were no regional wall motion abnormalities. Left ventricular diastolic function parameters were normal. - Aortic valve: Structurally normal valve. Trileaflet. Trivial regurgitation. - Aorta: Aortic root dimension: 42mm (ED). - Ascending aorta: The ascending aorta was mildly dilated. 4.1 cm. - Mitral valve: Mildly thickened leaflets . Mild regurgitation. - Left atrium: The atrium was mildly dilated. (21 cm2). - Right atrium: The atrium was mildly dilated. - Inferior vena cava: The vessel was normal in size; the respirophasic diameter changes were in the normal range (= 50%); findings are consistent with normal central venous pressure. - Pericardium, extracardiac: There was no pericardial effusion.    Surgical History:  Past Surgical History  Procedure Laterality Date  . Cholecystectomy       Home Meds: Prior to Admission medications   Medication Sig Start Date End Date Taking? Authorizing Provider  acetaminophen (TYLENOL) 500 MG tablet Take 500 mg by mouth every 6 (six) hours  as needed for mild pain.   Yes Historical Provider, MD  dofetilide (TIKOSYN) 500 MCG capsule Take 1 capsule (500 mcg total) by mouth 2 (two) times daily. 08/31/13  Yes Quintella Reichert, MD  furosemide (LASIX) 20 MG tablet Take 1 tablet (20 mg total) by mouth daily. 08/04/13  Yes Quintella Reichert, MD  Magnesium 250 MG TABS Take 1 tablet (250 mg total) by mouth 3 (three) times daily. 08/10/13  Yes Quintella Reichert, MD  metoprolol succinate (TOPROL-XL) 25 MG 24 hr tablet Take 12.5 mg by mouth daily.    Yes Historical Provider, MD  Multiple Vitamins-Minerals (CENTRUM SILVER PO) Take 1 tablet by mouth daily.     Yes Historical Provider, MD  potassium chloride SA (K-DUR,KLOR-CON) 20 MEQ tablet Take 40 mEq by mouth daily.    Yes Historical Provider, MD  ramipril (ALTACE) 5 MG capsule Take 5 mg by mouth daily.   Yes Historical Provider, MD  spironolactone (ALDACTONE) 25 MG tablet Take 0.5 tablets (12.5 mg total) by mouth daily. 07/22/13  Yes Quintella Reichert, MD  warfarin (COUMADIN) 1 MG tablet Take 1 mg by mouth daily. Takes 2 mg on Monday and 1 mg the rest of the week   Yes Historical Provider, MD    Inpatient Medications:  . dofetilide  500 mcg Oral BID  . furosemide  20 mg Oral Daily  . magnesium oxide  400 mg Oral TID  . potassium chloride SA  40 mEq Oral Daily  . ramipril  5 mg Oral Daily  . sodium chloride  3 mL Intravenous Q12H  . spironolactone  12.5 mg Oral Daily      Allergies: No Known Allergies  History   Social History  . Marital Status: Married    Spouse Name: N/A    Number of Children: N/A  . Years of Education: N/A   Occupational History  . Not on file.   Social History Main Topics  . Smoking status: Former Smoker    Quit date: 04/24/1983  . Smokeless tobacco: Never Used  . Alcohol Use: No  . Drug Use: No  . Sexual Activity: Not Currently   Other Topics Concern  . Not on file   Social History Narrative  . No narrative on file     Family History  Problem Relation Age of  Onset  . Other      Father died in WWII  . Colon cancer Mother   . Breast cancer Mother   . Cervical cancer Sister       Review of Systems: no bleeding issues. No visual changes, slurred speech, focal weakness, facial asymmetry, weight changes. All other systems reviewed and are otherwise negative except as noted above.  Labs:  Recent Labs  09/07/13 2108  TROPONINI <0.30   Lab Results  Component Value Date   WBC 8.9 09/07/2013   HGB 15.5 09/07/2013   HCT 43.0 09/07/2013   MCV 94.1 09/07/2013   PLT 133* 09/07/2013     Recent Labs Lab 09/07/13 2108  NA 139  K 4.2  CL 98  CO2 28  BUN 18  CREATININE 0.90  CALCIUM 9.3  PROT 6.9  BILITOT 3.0*  ALKPHOS 93  ALT 18  AST 26  GLUCOSE 156*   Radiology/Studies:  Dg Chest 2 View 09/07/2013   CLINICAL DATA:  Syncope.  Right anterior chest pain.  EXAM: CHEST  2 VIEW  COMPARISON:  05/17/2009  FINDINGS: Mild interstitial coarsening which is chronic. No edema or consolidation. No effusion or pneumothorax. Minimal opacity at the peripheral left base is likely scarring or atelectasis. This will be seen on abdominal CT which has already been ordered. Remote upper thoracic compression fracture.  IMPRESSION: 1. No edema or consolidation. 2. Minimal atelectasis or scar at the left base.   Electronically Signed   By: Tiburcio Pea M.D.   On: 09/07/2013 22:36   Ct Head Wo Contrast 09/07/2013   CLINICAL DATA:  Fall, loss of consciousness.  EXAM: CT HEAD WITHOUT CONTRAST  CT CERVICAL SPINE WITHOUT CONTRAST  TECHNIQUE: Multidetector CT imaging of the head and cervical spine was performed following the standard protocol without intravenous contrast. Multiplanar CT image reconstructions of the cervical spine were also generated.  COMPARISON:  CT of the sinus report dated December 09, 2001 though images are not available for direct comparison.  FINDINGS: CT HEAD FINDINGS  The ventricles and sulci are normal for age. No intraparenchymal hemorrhage, mass effect  nor midline shift. Patchy supratentorial white matter hypodensities are within normal range for patient's age  and though non-specific suggest sequelae of chronic small vessel ischemic disease. No acute large vascular territory infarcts.  No abnormal extra-axial fluid collections. Cystic 5.5 x 4.5 cm extra-axial right middle cranial fossa mass insinuating along the sylvian fissure most consistent with arachnoid cyst. Basal cisterns are patent. Moderate calcific atherosclerosis of the carotid siphons.  No skull fracture. Visualized paranasal sinuses and mastoid aircells are well-aerated. The included ocular globes and orbital contents are non-suspicious. Soft tissue within the right external auditory canal may reflect cerumen.  CT CERVICAL SPINE FINDINGS  Cervical vertebral bodies and posterior elements are intact. Grade 1 C3-4 retrolisthesis on degenerative basis. Moderate to severe C3-4 and moderate C5-6 degenerative disc disease, mild at C4-5. No destructive bony lesions. Severe right C1-2 arthropathy. No destructive bony lesions. Included prevertebral and paraspinal soft tissues are nonsuspicious.  Degenerative disc disease and facet arthropathy result in moderate canal stenosis at C3-4. Moderate C3-4 and mild to moderate C5-6 neural foraminal narrowing.  IMPRESSION: CT head:  No acute intracranial process.  Right middle cranial fossa arachnoid cyst. Involutional changes. Mild white matter changes suggest chronic small vessel ischemic disease.  CT cervical spine: No acute fracture. Grade 1 C3-4 retrolisthesis on degenerative basis.   Electronically Signed   By: Awilda Metroourtnay  Bloomer   On: 09/07/2013 22:52   Ct Cervical Spine Wo Contrast 09/07/2013   CLINICAL DATA:  Fall, loss of consciousness.  EXAM: CT HEAD WITHOUT CONTRAST  CT CERVICAL SPINE WITHOUT CONTRAST  TECHNIQUE: Multidetector CT imaging of the head and cervical spine was performed following the standard protocol without intravenous contrast. Multiplanar CT  image reconstructions of the cervical spine were also generated.  COMPARISON:  CT of the sinus report dated December 09, 2001 though images are not available for direct comparison.  FINDINGS: CT HEAD FINDINGS  The ventricles and sulci are normal for age. No intraparenchymal hemorrhage, mass effect nor midline shift. Patchy supratentorial white matter hypodensities are within normal range for patient's age and though non-specific suggest sequelae of chronic small vessel ischemic disease. No acute large vascular territory infarcts.  No abnormal extra-axial fluid collections. Cystic 5.5 x 4.5 cm extra-axial right middle cranial fossa mass insinuating along the sylvian fissure most consistent with arachnoid cyst. Basal cisterns are patent. Moderate calcific atherosclerosis of the carotid siphons.  No skull fracture. Visualized paranasal sinuses and mastoid aircells are well-aerated. The included ocular globes and orbital contents are non-suspicious. Soft tissue within the right external auditory canal may reflect cerumen.  CT CERVICAL SPINE FINDINGS  Cervical vertebral bodies and posterior elements are intact. Grade 1 C3-4 retrolisthesis on degenerative basis. Moderate to severe C3-4 and moderate C5-6 degenerative disc disease, mild at C4-5. No destructive bony lesions. Severe right C1-2 arthropathy. No destructive bony lesions. Included prevertebral and paraspinal soft tissues are nonsuspicious.  Degenerative disc disease and facet arthropathy result in moderate canal stenosis at C3-4. Moderate C3-4 and mild to moderate C5-6 neural foraminal narrowing.  IMPRESSION: CT head:  No acute intracranial process.  Right middle cranial fossa arachnoid cyst. Involutional changes. Mild white matter changes suggest chronic small vessel ischemic disease.  CT cervical spine: No acute fracture. Grade 1 C3-4 retrolisthesis on degenerative basis.   Electronically Signed   By: Awilda Metroourtnay  Bloomer   On: 09/07/2013 22:52   Ct Abdomen Pelvis W  Contrast 09/07/2013   CLINICAL DATA:  Fall, bruising to right abdomen  EXAM: CT ABDOMEN AND PELVIS WITH CONTRAST  TECHNIQUE: Multidetector CT imaging of the abdomen and pelvis was performed using the  standard protocol following bolus administration of intravenous contrast.  CONTRAST:  OMNIPAQUE IOHEXOL 300 MG/ML  SOLN  COMPARISON:  None.  FINDINGS: Lower Chest: Mild dependent atelectasis in the lower lobes. Visualized cardiac structures within normal limits for size. No pericardial effusion. Unremarkable visualized distal thoracic esophagus.  Abdomen: Unremarkable CT appearance of the stomach, duodenum, adrenal glands and pancreas. 4 mm circumscribed hypoattenuating lesion in the hepatic dome is too small to characterize but statistically likely a benign cyst. Nonspecific 5 mm low-attenuation lesion in hepatic segment 2. This is also too small to accurately characterize. Patient is status post cholecystectomy. No intra or extrahepatic biliary ductal dilatation. Borderline splenomegaly. The spleen likely remains within normal limits. No focal lesion. Unremarkable appearance of the bilateral kidneys. No focal solid lesion, hydronephrosis or nephrolithiasis.  Redundant sigmoid colon. Moderate volume of colonic stool suggests underlying constipation. Normal appendix in the right lower quadrant. Unremarkable terminal ileum. No bowel wall thickening or evidence of obstruction. Right inguinal hernia containing loops of unremarkable distal ileum. No evidence of hemo peritoneum or solid organ injury.  Pelvis: Unremarkable bladder, prostate gland and seminal vesicles. No free fluid or suspicious adenopathy.  Bones/Soft Tissues: No acute fracture or aggressive appearing lytic or blastic osseous lesion. Multilevel degenerative disc disease in the lumbar spine most significant at L5-S1.  Vascular: No significant atherosclerotic vascular disease, aneurysmal dilatation or acute abnormality.  IMPRESSION: 1. No evidence of  acute injury to the abdomen or pelvis. 2. Moderately large right inguinal hernia containing loops of unremarkable distal ileum. 3. Sub cm low-attenuation hepatic lesions. If the patient has no known history of primary malignancy, these are statistically highly likely to represent benign cysts. If there is a history of prior malignancy and metastatic disease is a consideration, recommend further evaluation with MRI of the abdomen with and without contrast in 3 months. 4. Multilevel degenerative disc disease most significant at L5-S1.   Electronically Signed   By: Malachy Moan M.D.   On: 09/07/2013 23:15   Dg Knee Complete 4 Views Left 09/07/2013   CLINICAL DATA:  Fall with knee pain.  EXAM: LEFT KNEE - COMPLETE 4+ VIEW  COMPARISON:  None.  FINDINGS: No evidence of acute fracture, joint effusion, or malalignment. Tricompartmental osteoarthritis with marginal spurs and mild medial compartment narrowing. Irregularity of the fibular neck ventral cortex is likely chronic/ incidental.  IMPRESSION: No acute osseous abnormality.   Electronically Signed   By: Tiburcio Pea M.D.   On: 09/07/2013 22:39   Dg Knee Complete 4 Views Right 09/07/2013   CLINICAL DATA:  Fall.  Knee pain.  EXAM: RIGHT KNEE - COMPLETE 4+ VIEW  COMPARISON:  None.  FINDINGS: No acute fracture or malalignment. No significant joint effusion. There is tricompartmental osteoarthritis with marginal spurring and moderate medial compartment narrowing.  IMPRESSION: 1. No acute osseous findings. 2. Tricompartmental osteoarthritis.   Electronically Signed   By: Tiburcio Pea M.D.   On: 09/07/2013 22:38    EKG:  09/07/13 20:40 sinus bradycardia 55bpm, possible LAE, TWI avL, QTc 434 This AM: 49bpm sinus bradycardia 49bpm QTC 446 TW flattening avL otherwise no acute changes  Physical Exam: Blood pressure 95/56, pulse 55, temperature 97.4 F (36.3 C), temperature source Oral, resp. rate 18, height 5\' 11"  (1.803 m), weight 177 lb 1.6 oz (80.332 kg),  SpO2 100.00%. General: Well developed, well nourished WM in no acute distress. Head: Normocephalic, atraumatic, sclera non-icteric, no xanthomas, nares are without discharge.  Neck: Negative for carotid bruits. JVD not elevated.  Lungs: Clear bilaterally to auscultation without wheezes, rales, or rhonchi. Breathing is unlabored. Heart: RRR with S1 S2. No murmurs, rubs, or gallops appreciated. Abdomen: Soft, non-tender, non-distended with normoactive bowel sounds. No hepatomegaly. No rebound/guarding. No obvious abdominal masses. Msk:  Strength and tone appear normal for age. Extremities: No clubbing or cyanosis. No edema.  Distal pedal pulses are 2+ and equal bilaterally. Neuro: Alert and oriented X 3. No facial asymmetry. No focal deficit. Moves all extremities spontaneously. Psych:  Responds to questions appropriately with a normal affect.   Assessment and Plan:   1. Syncope 2. 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 Sinus bradycardia 4. Nonischemic cardiomyopathy EF 45% by echo 11/2010; 60-65% by echo this admission, euvolemic 5. Mild aortic root dilitation by echo this admission 6. Hepatic lesion by CT, will need to followup PCP 7. Hyperglycemia  D/w MD. 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 Agree with holding beta blocker for now. Follow BPs. Check TSH, free T4, Mg level. Ok to ambulate patient. Will ask EP to see tomorrow morning. It would be interesting to be able to see another episode of PAF followed by conversion to NSR to see what he does. Needs to f/u PCP for hepatic CT findings - does not have any history of primary malignancy so suspect benign cysts. No driving until cleared by cardiology in followup.  Signed, Ronie Spies PA-C 09/08/2013, 2:31 PM   Attending Note:   The patient was seen and examined.  Agree with assessment and plan as noted above.  Changes made to the above note as  needed.  1.  syncope: He has documented episodes of atrial fibrillation with a ventricular rate of around 105. He also has documented sinus bradycardia at in the 40s.  He had a sudden episode of syncope while in the shower. I suspect that he had a postconversion pause.  Otherwise he seems to be very functional and very active. He does not have any signs or symptoms of heart failure or angina. His left ventricular systolic function has improved on medical therapy.  His metoprolol has been held at this point. I would agree with continuing his other heart failure medications since he seems to be asymptomatic from that standpoint. We will have him see the EP team. I suspect that he needs a pacemaker.  2. Chronic systolic congestive heart failure: The patient's ejection fraction has improved on medical therapy. I would continue with his current medications except for as noted above.  3. Atrial fibrillation: The patient is on maximum dose Tikosyn  and despite that, he is having breakthrough atrial fibrillation.  We'll have EP see him for further evaluation of that.    He's currently on Coumadin.   Vesta Mixer, Montez Hageman., MD, [facility] 09/08/2013, 4:21 PM

## 2013-09-08 NOTE — H&P (Signed)
[facility] History and Physical  AIMEE TIMMONS ZOX:[id number] DOB: 1941-06-10 DOA: 09/07/2013  Referring physician: EDP PCP: Quintella Reichert, MD   Chief Complaint: Syncope   HPI: Joseph Allison is a 73 y.o. male with history of A.Fib, DCM, bradycardia, who presents to the ED at [facility] today after a sudden syncopal episode that occurred while in the shower at home today.  States there was no warning, he just suddenly passed out and next thing he knew he was waking up on the floor.  Did strike his head falling (as evidenced by an abrasion on his head), thankfully no ICH seen on CT scan despite patient being on chronic coumadin for A.Fib.  Patient transferred to [facility] for workup.  Review of Systems: Systems reviewed.  As above, otherwise negative  Past Medical History  Diagnosis Date  . Thrombocytopenia   . Atrial fibrillation     failed DCCV 08/2010; Tikoysn load 12/2010  . Asthma   . Sleep apnea   . NICM (nonischemic cardiomyopathy)     tachycardia mediated;  CLite (08/2010): no ischemia; EF 32%; Echo (11/2010):  EF 45%, mod BAE, mild AI, mild MR, mild to mod TR  . OSA (obstructive sleep apnea)   . Inguinal hernia    Past Surgical History  Procedure Laterality Date  . Cholecystectomy     Social History:  reports that he quit smoking about 30 years ago. He has never used smokeless tobacco. He reports that he does not drink alcohol or use illicit drugs.  No Known Allergies  No family history on file.   Prior to Admission medications   Medication Sig Start Date End Date Taking? Authorizing Provider  dofetilide (TIKOSYN) 500 MCG capsule Take 1 capsule (500 mcg total) by mouth 2 (two) times daily. 08/31/13   Quintella Reichert, MD  furosemide (LASIX) 20 MG tablet Take 1 tablet (20 mg total) by mouth daily. 08/04/13   Quintella Reichert, MD  glucosamine-chondroitin 500-400 MG tablet Take 2 tablets by mouth at bedtime.     Historical Provider, MD  Magnesium 250 MG TABS Take 1 tablet (250  mg total) by mouth 3 (three) times daily. 08/10/13   Quintella Reichert, MD  Magnesium Oxide -Mg Supplement 250 MG TABS 1 tablet by mouth twice a day 07/29/13   Quintella Reichert, MD  metoprolol succinate (TOPROL-XL) 25 MG 24 hr tablet Take 12.5 mg by mouth daily.     Historical Provider, MD  Multiple Vitamins-Minerals (CENTRUM SILVER PO) Take 1 tablet by mouth daily.      Historical Provider, MD  potassium chloride SA (K-DUR,KLOR-CON) 20 MEQ tablet Take 40 mEq by mouth daily.     Historical Provider, MD  ramipril (ALTACE) 5 MG capsule TAKE ONE CAPSULE BY MOUTH EVERY DAY 05/12/13   Quintella Reichert, MD  spironolactone (ALDACTONE) 25 MG tablet Take 0.5 tablets (12.5 mg total) by mouth daily. 07/22/13   Quintella Reichert, MD  warfarin (COUMADIN) 1 MG tablet Take 1 mg by mouth daily. Takes 2 mg on Monday and Friday all other days takes 1 mg.    Historical Provider, MD   Physical Exam: Filed Vitals:   09/08/13 0145  BP: 102/54  Pulse: 51  Temp: 97.8 F (36.6 C)  Resp: 18    BP 102/54  Pulse 51  Temp(Src) 97.8 F (36.6 C) (Oral)  Resp 18  Ht 5\' 11"  (1.803 m)  Wt 80.332 kg (177 lb 1.6 oz)  BMI 24.71 kg/m2  SpO2  97%  General Appearance:    Alert, oriented, no distress, appears stated age  Head:    Normocephalic, atraumatic  Eyes:    PERRL, EOMI, sclera non-icteric        Nose:   Nares without drainage or epistaxis. Mucosa, turbinates normal  Throat:   Moist mucous membranes. Oropharynx without erythema or exudate.  Neck:   Supple. No carotid bruits.  No thyromegaly.  No lymphadenopathy.   Back:     No CVA tenderness, no spinal tenderness  Lungs:     Clear to auscultation bilaterally, without wheezes, rhonchi or rales  Chest wall:    No tenderness to palpitation  Heart:    Regular rate and rhythm without murmurs, gallops, rubs  Abdomen:     Soft, non-tender, nondistended, normal bowel sounds, no organomegaly  Genitalia:    deferred  Rectal:    deferred  Extremities:   No clubbing, cyanosis or  edema.  Pulses:   2+ and symmetric all extremities  Skin:   Skin color, texture, turgor normal, no rashes or lesions  Lymph nodes:   Cervical, supraclavicular, and axillary nodes normal  Neurologic:   CNII-XII intact. Normal strength, sensation and reflexes      throughout    Labs on Admission:  Basic Metabolic Panel:  Recent Labs Lab 09/07/13 2108  NA 139  K 4.2  CL 98  CO2 28  GLUCOSE 156*  BUN 18  CREATININE 0.90  CALCIUM 9.3   Liver Function Tests:  Recent Labs Lab 09/07/13 2108  AST 26  ALT 18  ALKPHOS 93  BILITOT 3.0*  PROT 6.9  ALBUMIN 4.2   No results found for this basename: LIPASE, AMYLASE,  in the last 168 hours No results found for this basename: AMMONIA,  in the last 168 hours CBC:  Recent Labs Lab 09/07/13 2108  WBC 8.9  NEUTROABS 6.9  HGB 15.5  HCT 43.0  MCV 94.1  PLT 133*   Cardiac Enzymes:  Recent Labs Lab 09/07/13 2108  TROPONINI <0.30    BNP (last 3 results) No results found for this basename: PROBNP,  in the last 8760 hours CBG: No results found for this basename: GLUCAP,  in the last 168 hours  Radiological Exams on Admission: Dg Chest 2 View  09/07/2013   CLINICAL DATA:  Syncope.  Right anterior chest pain.  EXAM: CHEST  2 VIEW  COMPARISON:  05/17/2009  FINDINGS: Mild interstitial coarsening which is chronic. No edema or consolidation. No effusion or pneumothorax. Minimal opacity at the peripheral left base is likely scarring or atelectasis. This will be seen on abdominal CT which has already been ordered. Remote upper thoracic compression fracture.  IMPRESSION: 1. No edema or consolidation. 2. Minimal atelectasis or scar at the left base.   Electronically Signed   By: Tiburcio Pea M.D.   On: 09/07/2013 22:36   Ct Head Wo Contrast  09/07/2013   CLINICAL DATA:  Fall, loss of consciousness.  EXAM: CT HEAD WITHOUT CONTRAST  CT CERVICAL SPINE WITHOUT CONTRAST  TECHNIQUE: Multidetector CT imaging of the head and cervical spine was  performed following the standard protocol without intravenous contrast. Multiplanar CT image reconstructions of the cervical spine were also generated.  COMPARISON:  CT of the sinus report dated December 09, 2001 though images are not available for direct comparison.  FINDINGS: CT HEAD FINDINGS  The ventricles and sulci are normal for age. No intraparenchymal hemorrhage, mass effect nor midline shift. Patchy supratentorial white matter hypodensities are within  normal range for patient's age and though non-specific suggest sequelae of chronic small vessel ischemic disease. No acute large vascular territory infarcts.  No abnormal extra-axial fluid collections. Cystic 5.5 x 4.5 cm extra-axial right middle cranial fossa mass insinuating along the sylvian fissure most consistent with arachnoid cyst. Basal cisterns are patent. Moderate calcific atherosclerosis of the carotid siphons.  No skull fracture. Visualized paranasal sinuses and mastoid aircells are well-aerated. The included ocular globes and orbital contents are non-suspicious. Soft tissue within the right external auditory canal may reflect cerumen.  CT CERVICAL SPINE FINDINGS  Cervical vertebral bodies and posterior elements are intact. Grade 1 C3-4 retrolisthesis on degenerative basis. Moderate to severe C3-4 and moderate C5-6 degenerative disc disease, mild at C4-5. No destructive bony lesions. Severe right C1-2 arthropathy. No destructive bony lesions. Included prevertebral and paraspinal soft tissues are nonsuspicious.  Degenerative disc disease and facet arthropathy result in moderate canal stenosis at C3-4. Moderate C3-4 and mild to moderate C5-6 neural foraminal narrowing.  IMPRESSION: CT head:  No acute intracranial process.  Right middle cranial fossa arachnoid cyst. Involutional changes. Mild white matter changes suggest chronic small vessel ischemic disease.  CT cervical spine: No acute fracture. Grade 1 C3-4 retrolisthesis on degenerative basis.    Electronically Signed   By: Awilda Metro   On: 09/07/2013 22:52   Ct Cervical Spine Wo Contrast  09/07/2013   CLINICAL DATA:  Fall, loss of consciousness.  EXAM: CT HEAD WITHOUT CONTRAST  CT CERVICAL SPINE WITHOUT CONTRAST  TECHNIQUE: Multidetector CT imaging of the head and cervical spine was performed following the standard protocol without intravenous contrast. Multiplanar CT image reconstructions of the cervical spine were also generated.  COMPARISON:  CT of the sinus report dated December 09, 2001 though images are not available for direct comparison.  FINDINGS: CT HEAD FINDINGS  The ventricles and sulci are normal for age. No intraparenchymal hemorrhage, mass effect nor midline shift. Patchy supratentorial white matter hypodensities are within normal range for patient's age and though non-specific suggest sequelae of chronic small vessel ischemic disease. No acute large vascular territory infarcts.  No abnormal extra-axial fluid collections. Cystic 5.5 x 4.5 cm extra-axial right middle cranial fossa mass insinuating along the sylvian fissure most consistent with arachnoid cyst. Basal cisterns are patent. Moderate calcific atherosclerosis of the carotid siphons.  No skull fracture. Visualized paranasal sinuses and mastoid aircells are well-aerated. The included ocular globes and orbital contents are non-suspicious. Soft tissue within the right external auditory canal may reflect cerumen.  CT CERVICAL SPINE FINDINGS  Cervical vertebral bodies and posterior elements are intact. Grade 1 C3-4 retrolisthesis on degenerative basis. Moderate to severe C3-4 and moderate C5-6 degenerative disc disease, mild at C4-5. No destructive bony lesions. Severe right C1-2 arthropathy. No destructive bony lesions. Included prevertebral and paraspinal soft tissues are nonsuspicious.  Degenerative disc disease and facet arthropathy result in moderate canal stenosis at C3-4. Moderate C3-4 and mild to moderate C5-6 neural foraminal  narrowing.  IMPRESSION: CT head:  No acute intracranial process.  Right middle cranial fossa arachnoid cyst. Involutional changes. Mild white matter changes suggest chronic small vessel ischemic disease.  CT cervical spine: No acute fracture. Grade 1 C3-4 retrolisthesis on degenerative basis.   Electronically Signed   By: Awilda Metro   On: 09/07/2013 22:52   Ct Abdomen Pelvis W Contrast  09/07/2013   CLINICAL DATA:  Fall, bruising to right abdomen  EXAM: CT ABDOMEN AND PELVIS WITH CONTRAST  TECHNIQUE: Multidetector CT imaging of the  abdomen and pelvis was performed using the standard protocol following bolus administration of intravenous contrast.  CONTRAST:  OMNIPAQUE IOHEXOL 300 MG/ML  SOLN  COMPARISON:  None.  FINDINGS: Lower Chest: Mild dependent atelectasis in the lower lobes. Visualized cardiac structures within normal limits for size. No pericardial effusion. Unremarkable visualized distal thoracic esophagus.  Abdomen: Unremarkable CT appearance of the stomach, duodenum, adrenal glands and pancreas. 4 mm circumscribed hypoattenuating lesion in the hepatic dome is too small to characterize but statistically likely a benign cyst. Nonspecific 5 mm low-attenuation lesion in hepatic segment 2. This is also too small to accurately characterize. Patient is status post cholecystectomy. No intra or extrahepatic biliary ductal dilatation. Borderline splenomegaly. The spleen likely remains within normal limits. No focal lesion. Unremarkable appearance of the bilateral kidneys. No focal solid lesion, hydronephrosis or nephrolithiasis.  Redundant sigmoid colon. Moderate volume of colonic stool suggests underlying constipation. Normal appendix in the right lower quadrant. Unremarkable terminal ileum. No bowel wall thickening or evidence of obstruction. Right inguinal hernia containing loops of unremarkable distal ileum. No evidence of hemo peritoneum or solid organ injury.  Pelvis: Unremarkable bladder,  prostate gland and seminal vesicles. No free fluid or suspicious adenopathy.  Bones/Soft Tissues: No acute fracture or aggressive appearing lytic or blastic osseous lesion. Multilevel degenerative disc disease in the lumbar spine most significant at L5-S1.  Vascular: No significant atherosclerotic vascular disease, aneurysmal dilatation or acute abnormality.  IMPRESSION: 1. No evidence of acute injury to the abdomen or pelvis. 2. Moderately large right inguinal hernia containing loops of unremarkable distal ileum. 3. Sub cm low-attenuation hepatic lesions. If the patient has no known history of primary malignancy, these are statistically highly likely to represent benign cysts. If there is a history of prior malignancy and metastatic disease is a consideration, recommend further evaluation with MRI of the abdomen with and without contrast in 3 months. 4. Multilevel degenerative disc disease most significant at L5-S1.   Electronically Signed   By: Malachy Moan M.D.   On: 09/07/2013 23:15   Dg Knee Complete 4 Views Left  09/07/2013   CLINICAL DATA:  Fall with knee pain.  EXAM: LEFT KNEE - COMPLETE 4+ VIEW  COMPARISON:  None.  FINDINGS: No evidence of acute fracture, joint effusion, or malalignment. Tricompartmental osteoarthritis with marginal spurs and mild medial compartment narrowing. Irregularity of the fibular neck ventral cortex is likely chronic/ incidental.  IMPRESSION: No acute osseous abnormality.   Electronically Signed   By: Tiburcio Pea M.D.   On: 09/07/2013 22:39   Dg Knee Complete 4 Views Right  09/07/2013   CLINICAL DATA:  Fall.  Knee pain.  EXAM: RIGHT KNEE - COMPLETE 4+ VIEW  COMPARISON:  None.  FINDINGS: No acute fracture or malalignment. No significant joint effusion. There is tricompartmental osteoarthritis with marginal spurring and moderate medial compartment narrowing.  IMPRESSION: 1. No acute osseous findings. 2. Tricompartmental osteoarthritis.   Electronically Signed   By:  Tiburcio Pea M.D.   On: 09/07/2013 22:38    EKG: Independently reviewed. Sinus bradycardia.  Assessment/Plan Principal Problem:   Syncope Active Problems:   Atrial fibrillation   Cardiomyopathy   Chronic systolic heart failure   1. Syncope - most likely cardiogenic until proven otherwise in this patient who is bradycardic in the upper 40s lower 50s on the monitor right now, has known history of DCM. 1. Keep patient on tele monitor 2. Cards consult in AM 3. Holding the patients beta-blocker as he is currently sinus bradycardia,  will continue his tykosin for the time being for A.Fib control. 4. Will Continue coumadin for the time being as this is an isolated fall event. 5. 2d echo ordered for AM. 6. Will order orthostatic vitals but no evidence at this point that patient is overtly dehydrated, nor is his history suggestive of orthostatic syncope (no LOC on standing etc).    Code Status: Full Code  Family Communication: No family in room Disposition Plan: Admit to inpatient   Time spent: 50 min  Leilynn Pilat M. [facility] Pager [phone]  If 7AM-7PM, please contact the day team taking care of the patient [email or web address] Password [email or web address] 09/08/2013, 2:11 AM

## 2013-09-08 NOTE — Progress Notes (Signed)
Patient seen and examined, admitted by Dr. Julian Reil this morning  Briefly patient is a 73 year old male with history of atrial fibrillation on because I, bradycardia presented with sudden syncopal episode without any prodromal symptoms. He did strike his head, he is on chronic Coumadin for A. Fib.  BP 95/56  Pulse 55  Temp(Src) 97.4 F (36.3 C) (Oral)  Resp 18  Ht 5\' 11"  (1.803 m)  Wt 80.332 kg (177 lb 1.6 oz)  BMI 24.71 kg/m2  SpO2 100%  Syncope- most likely cardiogenic, patient was bradycardiac with a heart rate in the upper 40s to low 50s and has known history of DCM - Consulted cardiology, discussed with the Dr. Antoine Poche - Orthostatic negative - 2-D echo pending - Cardiac enzymes x1 negative   Shourya Macpherson M.D. [facility] 09/08/2013, 2:06 PM  Pager: [phone]

## 2013-09-09 DIAGNOSIS — R55 Syncope and collapse: Secondary | ICD-10-CM

## 2013-09-09 LAB — BASIC METABOLIC PANEL
BUN: 13 mg/dL (ref 6–23)
CHLORIDE: 100 meq/L (ref 96–112)
CO2: 28 mEq/L (ref 19–32)
CREATININE: 0.81 mg/dL (ref 0.50–1.35)
Calcium: 8.7 mg/dL (ref 8.4–10.5)
GFR calc Af Amer: >90 mL/min (ref >90)
GFR calc non Af Amer: 87 mL/min — ABNORMAL LOW (ref >90)
Glucose, Bld: 94 mg/dL (ref 70–99)
POTASSIUM: 4.5 meq/L (ref 3.7–5.3)
Sodium: 140 mEq/L (ref 137–147)

## 2013-09-09 LAB — TROPONIN I: Troponin I: <0.3 ng/mL (ref <0.30)

## 2013-09-09 LAB — HEMOGLOBIN A1C
Hgb A1c MFr Bld: 4.8 % (ref <5.7)
Mean Plasma Glucose: 91 mg/dL (ref <117)

## 2013-09-09 LAB — PROTIME-INR
INR: 3.23 — ABNORMAL HIGH (ref 0.00–1.49)
Prothrombin Time: 31.8 seconds — ABNORMAL HIGH (ref 11.6–15.2)

## 2013-09-09 MED ORDER — WARFARIN - PHARMACIST DOSING INPATIENT: Freq: Every day | Status: DC

## 2013-09-09 NOTE — Progress Notes (Signed)
ANTICOAGULATION CONSULT NOTE - Follow Up Consult  Pharmacy Consult for Coumadin Indication: atrial fibrillation  No Known Allergies  Patient Measurements: Height: 5\' 11"  (180.3 cm) Weight: 177 lb 1.6 oz (80.332 kg) IBW/kg (Calculated) : 75.3  Vital Signs: Temp: 98.6 F (37 C) (04/01 0500) Temp src: Axillary (03/31 2110) BP: 110/71 mmHg (04/01 0500) Pulse Rate: 59 (04/01 0500)  Labs:  Recent Labs  09/07/13 2108 09/08/13 1512 09/08/13 2006 09/09/13 0520 09/09/13 0530  HGB 15.5  --   --   --   --   HCT 43.0  --   --   --   --   PLT 133*  --   --   --   --   LABPROT 33.0*  --   --   --  31.8*  INR 3.39*  --   --   --  3.23*  CREATININE 0.90  --   --   --  0.81  TROPONINI <0.30 <0.30 <0.30 <0.30  --     Estimated Creatinine Clearance: 89.1 ml/min (by C-G formula based on Cr of 0.81).  Assessment: 72yom on coumadin pta for afib, admitted for evaluation of syncope. INR on admission was 3.36, dose held last night, and INR remains elevated at 3.23 today. No bleeding reported.  Home dose: 1mg  daily except 2mg  on Monday  Goal of Therapy:  INR 2-3 Monitor platelets by anticoagulation protocol: Yes   Plan:  1) No coumadin tonight 2) INR in AM  Fredrik Rigger 09/09/2013,8:54 AM

## 2013-09-09 NOTE — Consult Note (Signed)
ELECTROPHYSIOLOGY CONSULT NOTE   Patient ID: SAMIK BALKCOM MRN: [id number], DOB/AGE: [birth year]   Admit date: 09/07/2013 Date of Consult: 09/09/2013  Primary Cardiologist: Armanda Magic, MD Reason for Consultation: Bradycardia  History of Present Illness Joseph Allison is a 73 y.o. 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   He woke up Monday morning feeling like his usual self. He was nearing the end of his shower when he abruptly lost consciousness. He actually does not recall much about the morning leading up to the event. He states he remembers taking a shower and then the next thing he knew he was on the floor, "just staring at the floor tiles wondering what happened." His wife was with him and she did not report any seizure like activity or loss of bowel or bladder control. He denies CP, SOB or palpitations. He denies dizziness or lightheadedness. He denies nausea, vomiting or diaphoresis. Upon regaining consciousness, he felt like his usual self although sore. He apparently fell onto the commode and broke the lid prior to falling to the floor. He has never had syncope before this episode. He has not had any recent trouble with CP, SOB, palpitations or dizziness. He is not taking any new medications nor have there been any changes made to his current regimen. He denies recent illness or fever.   Orthostatic VS were negative although BP on softer side - BP: 103/60 > 102/59 > 115/77 HR: 52 > 52 > 61. Labs unremarkable except thrombocytopenia which is chronic. Serial troponin negative. ECG shows sinus bradycardia at 49 bpm without significant ST abnormalities, QTc 440. CT head negative for acute abnormality. C-spine negative. CXR without edema or consolidation. Echo reveals LVEF 60-65%, no rMWAs, trivial AI,  aortic root 42mm, mild MR, mildly dilated LA/RA, normal CVP. Telemetry reveals sinus rhythm with sinus bradycardia, episode of afib w/rvr on day of admission.   Past Medical History Past Medical History  Diagnosis Date  . Thrombocytopenia     a. Followed by Dr. Myna Hidalgo - chronic immune thrombocytopenia.   Marland Kitchen PAF (paroxysmal atrial fibrillation)     a. S/p prior DCCVs. b. Failed DCCV 08/2010 - started on Tikosyn 12/2010.  . Asthma   . NICM (nonischemic cardiomyopathy)     a. Presumed tachycardia mediated EF 45% in 11/2010. b. Nuc stress test 06/2010: no ischemia. One note suggests the patient had a nuc 08/2010 with no ischemia & EF 32% but cannot find evidence of this.  . OSA (obstructive sleep apnea)   . Inguinal hernia   . Sinus bradycardia     Past Surgical History Past Surgical History  Procedure Laterality Date  . Cholecystectomy      Allergies/Intolerances No Known Allergies  Current Home Medications      acetaminophen 500 MG tablet  Commonly known as:  TYLENOL  Take 500 mg by mouth every 6 (six) hours as needed for mild pain.     CENTRUM SILVER PO  Take 1 tablet by mouth daily.     dofetilide 500 MCG capsule  Commonly known as:  TIKOSYN  Take 1 capsule (500 mcg total) by mouth 2 (two) times daily.     furosemide 20 MG tablet  Commonly known as:  LASIX  Take 1 tablet (20 mg total) by mouth daily.     Magnesium 250 MG Tabs  Take 1 tablet (250 mg total) by mouth 3 (three) times daily.     metoprolol succinate 25 MG 24 hr tablet  Commonly known as:  TOPROL-XL  Take 12.5 mg by mouth daily.     potassium chloride SA 20 MEQ tablet  Commonly known as:  K-DUR,KLOR-CON  Take 40 mEq by mouth daily.     ramipril 5 MG capsule  Commonly known as:  ALTACE  Take 5 mg by mouth daily.     spironolactone 25 MG tablet  Commonly known as:  ALDACTONE  Take 0.5 tablets (12.5 mg total) by mouth daily.     warfarin 1 MG tablet  Commonly known as:  COUMADIN  Take 1 mg by mouth  daily. Takes 2 mg on Monday and 1 mg the rest of the week     Inpatient Medications . dofetilide  500 mcg Oral BID  . furosemide  20 mg Oral Daily  . magnesium oxide  400 mg Oral TID  . potassium chloride SA  40 mEq Oral Daily  . ramipril  5 mg Oral Daily  . sodium chloride  3 mL Intravenous Q12H  . spironolactone  12.5 mg Oral Daily  . Warfarin - Pharmacist Dosing Inpatient   Does not apply q1800    Family History Family History  Problem Relation Age of Onset  . Other      Father died in WWII  . Colon cancer Mother   . Breast cancer Mother   . Cervical cancer Sister      Social History History   Social History  . Marital Status: Married    Spouse Name: N/A    Number of Children: N/A  . Years of Education: N/A   Occupational History  . Not on file.   Social History Main Topics  . Smoking status: Former Smoker    Quit date: 04/24/1983  . Smokeless tobacco: Never Used  . Alcohol Use: No  . Drug Use: No  . Sexual Activity: Not Currently   Other Topics Concern  . Not on file   Social History Narrative  . No narrative on file     Review of Systems General: No chills, fever, night sweats or weight changes  Cardiovascular:  No chest pain, dyspnea on exertion, edema, orthopnea, palpitations, paroxysmal nocturnal dyspnea Dermatological: No rash, lesions or masses Respiratory: No cough, dyspnea Urologic: No hematuria, dysuria Abdominal: No nausea, vomiting, diarrhea, bright red blood per rectum, melena, or hematemesis Neurologic: No visual changes, weakness, changes in mental status All other systems reviewed and are otherwise negative except as noted above.  Physical Exam Vitals: Blood pressure 116/67, pulse 61, temperature 98.6 F (37 C), temperature source Axillary, resp. rate 16, height 5\' 11"  (1.803 m), weight 177 lb 1.6 oz (80.332 kg), SpO2 98.00%.  General: Well developed, well appearing 73 y.o. male in no acute distress. HEENT: Normocephalic, atraumatic.  EOMs intact. Sclera nonicteric. Oropharynx clear.  Neck: Supple without bruits. No JVD. Lungs: Respirations regular and unlabored, CTA bilaterally. No wheezes, rales or rhonchi. Heart: RRR. S1, S2 present. No murmurs, rub, S3 or S4. Abdomen: Soft, non-tender, non-distended. BS present x 4 quadrants. No hepatosplenomegaly.  Extremities: No clubbing, cyanosis or edema. DP/PT/Radials 2+ and equal bilaterally. Psych: Normal affect. Neuro: Alert and oriented X 3. Moves all extremities spontaneously. Musculoskeletal: No kyphosis. Skin: Intact. Warm and dry. No rashes or petechiae in exposed areas.   Labs  Recent Labs  09/07/13 2108 09/08/13 1512 09/08/13 2006 09/09/13 0520  TROPONINI <0.30 <  0.30 <0.30 <0.30   Lab Results  Component Value Date   WBC 8.9 09/07/2013   HGB 15.5 09/07/2013   HCT 43.0 09/07/2013   MCV 94.1 09/07/2013   PLT 133* 09/07/2013    Recent Labs Lab 09/07/13 2108 09/09/13 0530  NA 139 140  K 4.2 4.5  CL 98 100  CO2 28 28  BUN 18 13  CREATININE 0.90 0.81  CALCIUM 9.3 8.7  PROT 6.9  --   BILITOT 3.0*  --   ALKPHOS 93  --   ALT 18  --   AST 26  --   GLUCOSE 156* 94    Recent Labs  09/08/13 1745  TSH 2.850    Recent Labs  09/09/13 0530  INR 3.23*    Radiology/Studies Dg Chest 2 View  09/07/2013   CLINICAL DATA:  Syncope.  Right anterior chest pain.  EXAM: CHEST  2 VIEW  COMPARISON:  05/17/2009  FINDINGS: Mild interstitial coarsening which is chronic. No edema or consolidation. No effusion or pneumothorax. Minimal opacity at the peripheral left base is likely scarring or atelectasis. This will be seen on abdominal CT which has already been ordered. Remote upper thoracic compression fracture.  IMPRESSION: 1. No edema or consolidation. 2. Minimal atelectasis or scar at the left base.   Electronically Signed   By: Tiburcio PeaJonathan  Watts M.D.   On: 09/07/2013 22:36   Ct Head Wo Contrast  09/07/2013   CLINICAL DATA:  Fall, loss of consciousness.  EXAM: CT HEAD  WITHOUT CONTRAST  CT CERVICAL SPINE WITHOUT CONTRAST  TECHNIQUE: Multidetector CT imaging of the head and cervical spine was performed following the standard protocol without intravenous contrast. Multiplanar CT image reconstructions of the cervical spine were also generated.  COMPARISON:  CT of the sinus report dated December 09, 2001 though images are not available for direct comparison.  FINDINGS: CT HEAD FINDINGS  The ventricles and sulci are normal for age. No intraparenchymal hemorrhage, mass effect nor midline shift. Patchy supratentorial white matter hypodensities are within normal range for patient's age and though non-specific suggest sequelae of chronic small vessel ischemic disease. No acute large vascular territory infarcts.  No abnormal extra-axial fluid collections. Cystic 5.5 x 4.5 cm extra-axial right middle cranial fossa mass insinuating along the sylvian fissure most consistent with arachnoid cyst. Basal cisterns are patent. Moderate calcific atherosclerosis of the carotid siphons.  No skull fracture. Visualized paranasal sinuses and mastoid aircells are well-aerated. The included ocular globes and orbital contents are non-suspicious. Soft tissue within the right external auditory canal may reflect cerumen.  CT CERVICAL SPINE FINDINGS  Cervical vertebral bodies and posterior elements are intact. Grade 1 C3-4 retrolisthesis on degenerative basis. Moderate to severe C3-4 and moderate C5-6 degenerative disc disease, mild at C4-5. No destructive bony lesions. Severe right C1-2 arthropathy. No destructive bony lesions. Included prevertebral and paraspinal soft tissues are nonsuspicious.  Degenerative disc disease and facet arthropathy result in moderate canal stenosis at C3-4. Moderate C3-4 and mild to moderate C5-6 neural foraminal narrowing.  IMPRESSION: CT head:  No acute intracranial process.  Right middle cranial fossa arachnoid cyst. Involutional changes. Mild white matter changes suggest chronic  small vessel ischemic disease.  CT cervical spine: No acute fracture. Grade 1 C3-4 retrolisthesis on degenerative basis.   Electronically Signed   By: Awilda Metroourtnay  Bloomer   On: 09/07/2013 22:52   Ct Cervical Spine Wo Contrast  09/07/2013   CLINICAL DATA:  Fall, loss of consciousness.  EXAM: CT HEAD WITHOUT CONTRAST  CT CERVICAL SPINE WITHOUT CONTRAST  TECHNIQUE: Multidetector CT imaging of the head and cervical spine was performed following the standard protocol without intravenous contrast. Multiplanar CT image reconstructions of the cervical spine were also generated.  COMPARISON:  CT of the sinus report dated December 09, 2001 though images are not available for direct comparison.  FINDINGS: CT HEAD FINDINGS  The ventricles and sulci are normal for age. No intraparenchymal hemorrhage, mass effect nor midline shift. Patchy supratentorial white matter hypodensities are within normal range for patient's age and though non-specific suggest sequelae of chronic small vessel ischemic disease. No acute large vascular territory infarcts.  No abnormal extra-axial fluid collections. Cystic 5.5 x 4.5 cm extra-axial right middle cranial fossa mass insinuating along the sylvian fissure most consistent with arachnoid cyst. Basal cisterns are patent. Moderate calcific atherosclerosis of the carotid siphons.  No skull fracture. Visualized paranasal sinuses and mastoid aircells are well-aerated. The included ocular globes and orbital contents are non-suspicious. Soft tissue within the right external auditory canal may reflect cerumen.  CT CERVICAL SPINE FINDINGS  Cervical vertebral bodies and posterior elements are intact. Grade 1 C3-4 retrolisthesis on degenerative basis. Moderate to severe C3-4 and moderate C5-6 degenerative disc disease, mild at C4-5. No destructive bony lesions. Severe right C1-2 arthropathy. No destructive bony lesions. Included prevertebral and paraspinal soft tissues are nonsuspicious.  Degenerative disc  disease and facet arthropathy result in moderate canal stenosis at C3-4. Moderate C3-4 and mild to moderate C5-6 neural foraminal narrowing.  IMPRESSION: CT head:  No acute intracranial process.  Right middle cranial fossa arachnoid cyst. Involutional changes. Mild white matter changes suggest chronic small vessel ischemic disease.  CT cervical spine: No acute fracture. Grade 1 C3-4 retrolisthesis on degenerative basis.   Electronically Signed   By: Awilda Metro   On: 09/07/2013 22:52   Ct Abdomen Pelvis W Contrast  09/07/2013   CLINICAL DATA:  Fall, bruising to right abdomen  EXAM: CT ABDOMEN AND PELVIS WITH CONTRAST  TECHNIQUE: Multidetector CT imaging of the abdomen and pelvis was performed using the standard protocol following bolus administration of intravenous contrast.  CONTRAST:  OMNIPAQUE IOHEXOL 300 MG/ML  SOLN  COMPARISON:  None.  FINDINGS: Lower Chest: Mild dependent atelectasis in the lower lobes. Visualized cardiac structures within normal limits for size. No pericardial effusion. Unremarkable visualized distal thoracic esophagus.  Abdomen: Unremarkable CT appearance of the stomach, duodenum, adrenal glands and pancreas. 4 mm circumscribed hypoattenuating lesion in the hepatic dome is too small to characterize but statistically likely a benign cyst. Nonspecific 5 mm low-attenuation lesion in hepatic segment 2. This is also too small to accurately characterize. Patient is status post cholecystectomy. No intra or extrahepatic biliary ductal dilatation. Borderline splenomegaly. The spleen likely remains within normal limits. No focal lesion. Unremarkable appearance of the bilateral kidneys. No focal solid lesion, hydronephrosis or nephrolithiasis.  Redundant sigmoid colon. Moderate volume of colonic stool suggests underlying constipation. Normal appendix in the right lower quadrant. Unremarkable terminal ileum. No bowel wall thickening or evidence of obstruction. Right inguinal hernia  containing loops of unremarkable distal ileum. No evidence of hemo peritoneum or solid organ injury.  Pelvis: Unremarkable bladder, prostate gland and seminal vesicles. No free fluid or suspicious adenopathy.  Bones/Soft Tissues: No acute fracture or aggressive appearing lytic or blastic osseous lesion. Multilevel degenerative disc disease in the lumbar spine most significant at L5-S1.  Vascular: No significant atherosclerotic vascular disease, aneurysmal dilatation or acute abnormality.  IMPRESSION: 1. No evidence of acute injury  to the abdomen or pelvis. 2. Moderately large right inguinal hernia containing loops of unremarkable distal ileum. 3. Sub cm low-attenuation hepatic lesions. If the patient has no known history of primary malignancy, these are statistically highly likely to represent benign cysts. If there is a history of prior malignancy and metastatic disease is a consideration, recommend further evaluation with MRI of the abdomen with and without contrast in 3 months. 4. Multilevel degenerative disc disease most significant at L5-S1.   Electronically Signed   By: Malachy Moan M.D.   On: 09/07/2013 23:15   Dg Knee Complete 4 Views Left  09/07/2013   CLINICAL DATA:  Fall with knee pain.  EXAM: LEFT KNEE - COMPLETE 4+ VIEW  COMPARISON:  None.  FINDINGS: No evidence of acute fracture, joint effusion, or malalignment. Tricompartmental osteoarthritis with marginal spurs and mild medial compartment narrowing. Irregularity of the fibular neck ventral cortex is likely chronic/ incidental.  IMPRESSION: No acute osseous abnormality.   Electronically Signed   By: Tiburcio Pea M.D.   On: 09/07/2013 22:39   Dg Knee Complete 4 Views Right  09/07/2013   CLINICAL DATA:  Fall.  Knee pain.  EXAM: RIGHT KNEE - COMPLETE 4+ VIEW  COMPARISON:  None.  FINDINGS: No acute fracture or malalignment. No significant joint effusion. There is tricompartmental osteoarthritis with marginal spurring and moderate medial  compartment narrowing.  IMPRESSION: 1. No acute osseous findings. 2. Tricompartmental osteoarthritis.   Electronically Signed   By: Tiburcio Pea M.D.   On: 09/07/2013 22:38    Echocardiogram  Study Conclusions - Left ventricle: The cavity size was normal. Wall thickness was normal. Systolic function was normal. The estimated ejection fraction was in the range of 60% to 65%. Wall motion was normal; there were no regional wall motion abnormalities. Left ventricular diastolic function parameters were normal. - Aortic valve: Structurally normal valve. Trileaflet. Trivial regurgitation. - Aorta: Aortic root dimension: 42mm (ED). - Ascending aorta: The ascending aorta was mildly dilated. 4.1 cm. - Mitral valve: Mildly thickened leaflets . Mild regurgitation. - Left atrium: The atrium was mildly dilated. (21 cm2). - Right atrium: The atrium was mildly dilated. - Inferior vena cava: The vessel was normal in size; the respirophasic diameter changes were in the normal range (= 50%); findings are consistent with normal central venous pressure. - Pericardium, extracardiac: There was no pericardial effusion.  12-lead ECG on admission - sinus bradycardia at 49 bpm; no acute ST abnormalities; QTc 440  Telemetry reviewed - predominantly sinus rhythm with sinus bradycardia into 40s at times and brief afib w/rvr on day of admission (telemetry appears discontinued at times; per report, while at [facility] patient was taken off tele for procedures / CT; cannot see his transition from afib to sinus due to this)  Assessment and Plan 1. Syncope  2. Sinus bradycardia 3. Paroxysmal atrial fibrillation 4. Prior LV dysfunction, thought tachy-mediated, now normal LVEF   Signed, EDMISTEN, BROOKE, PA-C 09/09/2013, 11:02 AM  EP Attending  Patient seen and examined. I have reviewed the findings noted above. The patient has unexplained syncope, sinus bradycardia of uncertain clinical significance,  paroxysmal atrial fibrillation, and ongoing dofetilide therapy in the setting of a remote tachycardia mediated cardiomyopathy. The patient may have had a posttermination pauses and atrial fibrillation, he could have had a vagal episode, or he could have had polymorphic ventricular tachycardia as a consequence of dofetilide therapy. The likelihood of the latter is low. His QT interval has been normal. Options for diagnosis  would include wearing a cardiac monitor for 3 weeks, or insertion of an implantable loop recorder. I would recommend the latter. The patient is reflecting on his options. This could be placed tomorrow and the patient discharged home. He currently has sinus bradycardia but is asymptomatic.  Lewayne Bunting, M.D.

## 2013-09-09 NOTE — Progress Notes (Signed)
PROGRESS NOTE    Joseph Allison XID:[id number] DOB: 1940/09/10 DOA: 09/07/2013 PCP: Quintella Reichert, MD  HPI/Brief narrative 73 year old male with history of PAF, failed DCCV 3/12 and started on Tikosyn, OSA on nightly CPAP, nonischemic cardiomyopathy with EF 45%, chronic thrombocytopenia, admitted on 3/31 following an episode of syncope on 3/30 without any premonitory symptoms. Cardiology has seen the patient and the patient has evidence of tachybradycardia syndrome. EPS consultation is pending.  Assessment/Plan:  1. Syncope x1: Suspicious for cardiac etiology/arrhythmia. Patient has evidence of tachybradycardia on telemetry. Metoprolol has been held secondary to bradycardic episodes. Cardiology input appreciated and EPS consultation is pending. CT head and cervical spine without acute findings. We'll check carotid Dopplers. 2. Chronic systolic CHF/nonischemic cardiomyopathy with EF 45%: Compensated. 3. PAF: Currently in sinus bradycardia in the 50s to sinus rhythm. Continue Tikosyn and Coumadin. Management per cardiology. 4. OSA: Continue nightly CPAP 5. Chronic immune thrombocytopenia: Stable 6. Uncomplicated moderately large right inguinal hernia, seen on CT abdomen: Outpatient followup 7. Subcentimeter low-attenuation hepatic lesions on CT: Outpatient followup and workup as deemed necessary    Code Status: Full Family Communication: None at bedside Disposition Plan: Home when medically stable and cleared by cardiology   Consultants:  Cardiology/EPS  Procedures:  None  Antibiotics:  None   Subjective: Patient denies complaints. No dizziness, lightheadedness, chest pain, palpitations or sensation of passing out  Objective: Filed Vitals:   09/08/13 1314 09/08/13 2110 09/09/13 0500 09/09/13 0953  BP: 95/56 111/64 110/71 116/67  Pulse: 55 65 59 61  Temp: 97.4 F (36.3 C) 98.6 F (37 C) 98.6 F (37 C)   TempSrc: Oral Axillary    Resp:   16   Height:        Weight:      SpO2: 100% 99% 98%     Intake/Output Summary (Last 24 hours) at 09/09/13 1226 Last data filed at 09/08/13 1300  Gross per 24 hour  Intake    220 ml  Output      0 ml  Net    220 ml   Filed Weights   09/07/13 2040 09/08/13 0145  Weight: 83.008 kg (183 lb) 80.332 kg (177 lb 1.6 oz)     Exam:  General exam: Pleasant elderly male sitting up comfortably in bed. Respiratory system: Clear. No increased work of breathing. Cardiovascular system: S1 & S2 heard, RRR. No JVD, murmurs, gallops, clicks or pedal edema. Telemetry: Sinus bradycardia in the 50s-sinus rhythm Gastrointestinal system: Abdomen is nondistended, soft and nontender. Normal bowel sounds heard. Central nervous system: Alert and oriented. No focal neurological deficits. Extremities: Symmetric 5 x 5 power. HEENT: Approximately 2 mm size superficial laceration over left upper occipital scalp without active bleeding. Some dried blood over pillow cover.   Data Reviewed: Basic Metabolic Panel:  Recent Labs Lab 09/07/13 2108 09/08/13 1512 09/09/13 0530  NA 139  --  140  K 4.2  --  4.5  CL 98  --  100  CO2 28  --  28  GLUCOSE 156*  --  94  BUN 18  --  13  CREATININE 0.90  --  0.81  CALCIUM 9.3  --  8.7  MG  --  1.9  --    Liver Function Tests:  Recent Labs Lab 09/07/13 2108  AST 26  ALT 18  ALKPHOS 93  BILITOT 3.0*  PROT 6.9  ALBUMIN 4.2   No results found for this basename: LIPASE, AMYLASE,  in the last 168 hours No  results found for this basename: AMMONIA,  in the last 168 hours CBC:  Recent Labs Lab 09/07/13 2108  WBC 8.9  NEUTROABS 6.9  HGB 15.5  HCT 43.0  MCV 94.1  PLT 133*   Cardiac Enzymes:  Recent Labs Lab 09/07/13 2108 09/08/13 1512 09/08/13 2006 09/09/13 0520  TROPONINI <0.30 <0.30 <0.30 <0.30   BNP (last 3 results) No results found for this basename: PROBNP,  in the last 8760 hours CBG: No results found for this basename: GLUCAP,  in the last 168 hours  No  results found for this or any previous visit (from the past 240 hour(s)).    Additional labs: 1. TSH: 2.850 and free T4: 1.27     Studies: Dg Chest 2 View  09/07/2013   CLINICAL DATA:  Syncope.  Right anterior chest pain.  EXAM: CHEST  2 VIEW  COMPARISON:  05/17/2009  FINDINGS: Mild interstitial coarsening which is chronic. No edema or consolidation. No effusion or pneumothorax. Minimal opacity at the peripheral left base is likely scarring or atelectasis. This will be seen on abdominal CT which has already been ordered. Remote upper thoracic compression fracture.  IMPRESSION: 1. No edema or consolidation. 2. Minimal atelectasis or scar at the left base.   Electronically Signed   By: Tiburcio PeaJonathan  Watts M.D.   On: 09/07/2013 22:36   Ct Head Wo Contrast  09/07/2013   CLINICAL DATA:  Fall, loss of consciousness.  EXAM: CT HEAD WITHOUT CONTRAST  CT CERVICAL SPINE WITHOUT CONTRAST  TECHNIQUE: Multidetector CT imaging of the head and cervical spine was performed following the standard protocol without intravenous contrast. Multiplanar CT image reconstructions of the cervical spine were also generated.  COMPARISON:  CT of the sinus report dated December 09, 2001 though images are not available for direct comparison.  FINDINGS: CT HEAD FINDINGS  The ventricles and sulci are normal for age. No intraparenchymal hemorrhage, mass effect nor midline shift. Patchy supratentorial white matter hypodensities are within normal range for patient's age and though non-specific suggest sequelae of chronic small vessel ischemic disease. No acute large vascular territory infarcts.  No abnormal extra-axial fluid collections. Cystic 5.5 x 4.5 cm extra-axial right middle cranial fossa mass insinuating along the sylvian fissure most consistent with arachnoid cyst. Basal cisterns are patent. Moderate calcific atherosclerosis of the carotid siphons.  No skull fracture. Visualized paranasal sinuses and mastoid aircells are well-aerated. The  included ocular globes and orbital contents are non-suspicious. Soft tissue within the right external auditory canal may reflect cerumen.  CT CERVICAL SPINE FINDINGS  Cervical vertebral bodies and posterior elements are intact. Grade 1 C3-4 retrolisthesis on degenerative basis. Moderate to severe C3-4 and moderate C5-6 degenerative disc disease, mild at C4-5. No destructive bony lesions. Severe right C1-2 arthropathy. No destructive bony lesions. Included prevertebral and paraspinal soft tissues are nonsuspicious.  Degenerative disc disease and facet arthropathy result in moderate canal stenosis at C3-4. Moderate C3-4 and mild to moderate C5-6 neural foraminal narrowing.  IMPRESSION: CT head:  No acute intracranial process.  Right middle cranial fossa arachnoid cyst. Involutional changes. Mild white matter changes suggest chronic small vessel ischemic disease.  CT cervical spine: No acute fracture. Grade 1 C3-4 retrolisthesis on degenerative basis.   Electronically Signed   By: Awilda Metroourtnay  Bloomer   On: 09/07/2013 22:52   Ct Cervical Spine Wo Contrast  09/07/2013   CLINICAL DATA:  Fall, loss of consciousness.  EXAM: CT HEAD WITHOUT CONTRAST  CT CERVICAL SPINE WITHOUT CONTRAST  TECHNIQUE: Multidetector CT  imaging of the head and cervical spine was performed following the standard protocol without intravenous contrast. Multiplanar CT image reconstructions of the cervical spine were also generated.  COMPARISON:  CT of the sinus report dated December 09, 2001 though images are not available for direct comparison.  FINDINGS: CT HEAD FINDINGS  The ventricles and sulci are normal for age. No intraparenchymal hemorrhage, mass effect nor midline shift. Patchy supratentorial white matter hypodensities are within normal range for patient's age and though non-specific suggest sequelae of chronic small vessel ischemic disease. No acute large vascular territory infarcts.  No abnormal extra-axial fluid collections. Cystic 5.5 x 4.5 cm  extra-axial right middle cranial fossa mass insinuating along the sylvian fissure most consistent with arachnoid cyst. Basal cisterns are patent. Moderate calcific atherosclerosis of the carotid siphons.  No skull fracture. Visualized paranasal sinuses and mastoid aircells are well-aerated. The included ocular globes and orbital contents are non-suspicious. Soft tissue within the right external auditory canal may reflect cerumen.  CT CERVICAL SPINE FINDINGS  Cervical vertebral bodies and posterior elements are intact. Grade 1 C3-4 retrolisthesis on degenerative basis. Moderate to severe C3-4 and moderate C5-6 degenerative disc disease, mild at C4-5. No destructive bony lesions. Severe right C1-2 arthropathy. No destructive bony lesions. Included prevertebral and paraspinal soft tissues are nonsuspicious.  Degenerative disc disease and facet arthropathy result in moderate canal stenosis at C3-4. Moderate C3-4 and mild to moderate C5-6 neural foraminal narrowing.  IMPRESSION: CT head:  No acute intracranial process.  Right middle cranial fossa arachnoid cyst. Involutional changes. Mild white matter changes suggest chronic small vessel ischemic disease.  CT cervical spine: No acute fracture. Grade 1 C3-4 retrolisthesis on degenerative basis.   Electronically Signed   By: Awilda Metro   On: 09/07/2013 22:52   Ct Abdomen Pelvis W Contrast  09/07/2013   CLINICAL DATA:  Fall, bruising to right abdomen  EXAM: CT ABDOMEN AND PELVIS WITH CONTRAST  TECHNIQUE: Multidetector CT imaging of the abdomen and pelvis was performed using the standard protocol following bolus administration of intravenous contrast.  CONTRAST:  OMNIPAQUE IOHEXOL 300 MG/ML  SOLN  COMPARISON:  None.  FINDINGS: Lower Chest: Mild dependent atelectasis in the lower lobes. Visualized cardiac structures within normal limits for size. No pericardial effusion. Unremarkable visualized distal thoracic esophagus.  Abdomen: Unremarkable CT appearance of  the stomach, duodenum, adrenal glands and pancreas. 4 mm circumscribed hypoattenuating lesion in the hepatic dome is too small to characterize but statistically likely a benign cyst. Nonspecific 5 mm low-attenuation lesion in hepatic segment 2. This is also too small to accurately characterize. Patient is status post cholecystectomy. No intra or extrahepatic biliary ductal dilatation. Borderline splenomegaly. The spleen likely remains within normal limits. No focal lesion. Unremarkable appearance of the bilateral kidneys. No focal solid lesion, hydronephrosis or nephrolithiasis.  Redundant sigmoid colon. Moderate volume of colonic stool suggests underlying constipation. Normal appendix in the right lower quadrant. Unremarkable terminal ileum. No bowel wall thickening or evidence of obstruction. Right inguinal hernia containing loops of unremarkable distal ileum. No evidence of hemo peritoneum or solid organ injury.  Pelvis: Unremarkable bladder, prostate gland and seminal vesicles. No free fluid or suspicious adenopathy.  Bones/Soft Tissues: No acute fracture or aggressive appearing lytic or blastic osseous lesion. Multilevel degenerative disc disease in the lumbar spine most significant at L5-S1.  Vascular: No significant atherosclerotic vascular disease, aneurysmal dilatation or acute abnormality.  IMPRESSION: 1. No evidence of acute injury to the abdomen or pelvis. 2. Moderately large right  inguinal hernia containing loops of unremarkable distal ileum. 3. Sub cm low-attenuation hepatic lesions. If the patient has no known history of primary malignancy, these are statistically highly likely to represent benign cysts. If there is a history of prior malignancy and metastatic disease is a consideration, recommend further evaluation with MRI of the abdomen with and without contrast in 3 months. 4. Multilevel degenerative disc disease most significant at L5-S1.   Electronically Signed   By: Malachy Moan M.D.   On:  09/07/2013 23:15   Dg Knee Complete 4 Views Left  09/07/2013   CLINICAL DATA:  Fall with knee pain.  EXAM: LEFT KNEE - COMPLETE 4+ VIEW  COMPARISON:  None.  FINDINGS: No evidence of acute fracture, joint effusion, or malalignment. Tricompartmental osteoarthritis with marginal spurs and mild medial compartment narrowing. Irregularity of the fibular neck ventral cortex is likely chronic/ incidental.  IMPRESSION: No acute osseous abnormality.   Electronically Signed   By: Tiburcio Pea M.D.   On: 09/07/2013 22:39   Dg Knee Complete 4 Views Right  09/07/2013   CLINICAL DATA:  Fall.  Knee pain.  EXAM: RIGHT KNEE - COMPLETE 4+ VIEW  COMPARISON:  None.  FINDINGS: No acute fracture or malalignment. No significant joint effusion. There is tricompartmental osteoarthritis with marginal spurring and moderate medial compartment narrowing.  IMPRESSION: 1. No acute osseous findings. 2. Tricompartmental osteoarthritis.   Electronically Signed   By: Tiburcio Pea M.D.   On: 09/07/2013 22:38        Scheduled Meds: . dofetilide  500 mcg Oral BID  . furosemide  20 mg Oral Daily  . magnesium oxide  400 mg Oral TID  . potassium chloride SA  40 mEq Oral Daily  . ramipril  5 mg Oral Daily  . sodium chloride  3 mL Intravenous Q12H  . spironolactone  12.5 mg Oral Daily  . Warfarin - Pharmacist Dosing Inpatient   Does not apply q1800   Continuous Infusions:   Principal Problem:   Syncope Active Problems:   Atrial fibrillation   Nonischemic cardiomyopathy   Chronic systolic heart failure   Hepatic lesion by CT   Sinus bradycardia   Aortic root dilatation    Time spent: 30 minutes    Mareon Robinette, MD, FACP, FHM. [facility] Pager [phone]  If 7PM-7AM, please contact night-coverage [email or web address] Password [email or web address] 09/09/2013, 12:26 PM    LOS: 2 days

## 2013-09-09 NOTE — Progress Notes (Signed)
RT assessed patient for CPAP. Upon entering room, patient already on home CPAP and tolerating well. Instructed patient to let his nurse know if he needed me.

## 2013-09-09 NOTE — Progress Notes (Signed)
*  PRELIMINARY RESULTS* Vascular Ultrasound Carotid Duplex (Doppler) has been completed.   Findings suggest 1-39% internal carotid artery stenosis bilaterally. Vertebral arteries are patent with antegrade flow.  09/09/2013 3:26 PM Gertie Fey, RVT, RDCS, RDMS

## 2013-09-10 ENCOUNTER — Other Ambulatory Visit: Payer: Self-pay

## 2013-09-10 ENCOUNTER — Telehealth: Payer: Self-pay | Admitting: Cardiology

## 2013-09-10 ENCOUNTER — Encounter (HOSPITAL_COMMUNITY): Payer: Self-pay | Admitting: *Deleted

## 2013-09-10 ENCOUNTER — Encounter (HOSPITAL_COMMUNITY)
Admission: EM | Disposition: A | Discharge status: Home / Self Care | Payer: Managed Care, Other (non HMO) | Source: Home / Self Care | Attending: Internal Medicine

## 2013-09-10 DIAGNOSIS — R55 Syncope and collapse: Secondary | ICD-10-CM

## 2013-09-10 DIAGNOSIS — G4733 Obstructive sleep apnea (adult) (pediatric): Secondary | ICD-10-CM

## 2013-09-10 HISTORY — PX: LOOP RECORDER IMPLANT: SHX5477

## 2013-09-10 HISTORY — PX: LOOP RECORDER IMPLANT: SHX5954

## 2013-09-10 LAB — PROTIME-INR
INR: 3.03 — ABNORMAL HIGH (ref 0.00–1.49)
Prothrombin Time: 30.3 seconds — ABNORMAL HIGH (ref 11.6–15.2)

## 2013-09-10 SURGERY — LOOP RECORDER IMPLANT
Anesthesia: LOCAL

## 2013-09-10 MED ORDER — LIDOCAINE-EPINEPHRINE 1 %-1:100000 IJ SOLN
INTRAMUSCULAR | Status: AC
  Filled 2013-09-10: qty 1

## 2013-09-10 NOTE — Progress Notes (Signed)
 SUBJECTIVE: The patient is doing well today.  At this time, he denies chest pain, shortness of breath, or any new concerns.  He converted to sinus rhythm over night without termination pause or dizziness.   . [MAR HOLD] dofetilide  500 mcg Oral BID  . [MAR HOLD] furosemide  20 mg Oral Daily  . [MAR HOLD] magnesium oxide  400 mg Oral TID  . [MAR HOLD] potassium chloride SA  40 mEq Oral Daily  . [MAR HOLD] ramipril  5 mg Oral Daily  . [MAR HOLD] sodium chloride  3 mL Intravenous Q12H  . [MAR HOLD] spironolactone  12.5 mg Oral Daily  . [MAR HOLD] Warfarin - Pharmacist Dosing Inpatient   Does not apply q1800      OBJECTIVE: Physical Exam: Filed Vitals:   09/09/13 1502 09/09/13 1958 09/10/13 0017 09/10/13 0500  BP: 122/75 121/68 109/61 105/64  Pulse: 54 63 132 98  Temp: 97.3 F (36.3 C) 98.3 F (36.8 C)  97.4 F (36.3 C)  TempSrc: Oral Oral  Oral  Resp: 18 16  18  Height:      Weight:      SpO2: 100% 100% 98% 99%   No intake or output data in the 24 hours ending 09/10/13 0724  Telemetry reveals afib with conversion to sinus rhythm, no pauses/ brady events/ or ventricular arrhythmias  GEN- The patient is well appearing, alert and oriented x 3 today.   Head- normocephalic, atraumatic Eyes-  Sclera clear, conjunctiva pink Ears- hearing intact Oropharynx- clear Neck- supple, no JVP Lymph- no cervical lymphadenopathy Lungs- Clear to ausculation bilaterally, normal work of breathing Heart- Regular rate and rhythm  GI- soft, NT, ND, + BS Extremities- no clubbing, cyanosis, or edema Skin- no rash or lesion Psych- euthymic mood, full affect Neuro- strength and sensation are intact  LABS: Basic Metabolic Panel:  Recent Labs  09/07/13 2108 09/08/13 1512 09/09/13 0530  NA 139  --  140  K 4.2  --  4.5  CL 98  --  100  CO2 28  --  28  GLUCOSE 156*  --  94  BUN 18  --  13  CREATININE 0.90  --  0.81  CALCIUM 9.3  --  8.7  MG  --  1.9  --    Liver Function  Tests:  Recent Labs  09/07/13 2108  AST 26  ALT 18  ALKPHOS 93  BILITOT 3.0*  PROT 6.9  ALBUMIN 4.2   No results found for this basename: LIPASE, AMYLASE,  in the last 72 hours CBC:  Recent Labs  09/07/13 2108  WBC 8.9  NEUTROABS 6.9  HGB 15.5  HCT 43.0  MCV 94.1  PLT 133*   Cardiac Enzymes:  Recent Labs  09/08/13 1512 09/08/13 2006 09/09/13 0520  TROPONINI <0.30 <0.30 <0.30   BNP: No components found with this basename: POCBNP,  D-Dimer: No results found for this basename: DDIMER,  in the last 72 hours Hemoglobin A1C:  Recent Labs  09/09/13 0530  HGBA1C 4.8   Fasting Lipid Panel: No results found for this basename: CHOL, HDL, LDLCALC, TRIG, CHOLHDL, LDLDIRECT,  in the last 72 hours Thyroid Function Tests:  Recent Labs  09/08/13 1745  TSH 2.850   Anemia Panel: No results found for this basename: VITAMINB12, FOLATE, FERRITIN, TIBC, IRON, RETICCTPCT,  in the last 72 hours  RADIOLOGY: Dg Chest 2 View  09/07/2013   CLINICAL DATA:  Syncope.  Right anterior chest pain.  EXAM: CHEST  2   VIEW  COMPARISON:  05/17/2009  FINDINGS: Mild interstitial coarsening which is chronic. No edema or consolidation. No effusion or pneumothorax. Minimal opacity at the peripheral left base is likely scarring or atelectasis. This will be seen on abdominal CT which has already been ordered. Remote upper thoracic compression fracture.  IMPRESSION: 1. No edema or consolidation. 2. Minimal atelectasis or scar at the left base.   Electronically Signed   By: Jonathan  Watts M.D.   On: 09/07/2013 22:36   Ct Head Wo Contrast  09/07/2013   CLINICAL DATA:  Fall, loss of consciousness.  EXAM: CT HEAD WITHOUT CONTRAST  CT CERVICAL SPINE WITHOUT CONTRAST  TECHNIQUE: Multidetector CT imaging of the head and cervical spine was performed following the standard protocol without intravenous contrast. Multiplanar CT image reconstructions of the cervical spine were also generated.  COMPARISON:  CT of  the sinus report dated December 09, 2001 though images are not available for direct comparison.  FINDINGS: CT HEAD FINDINGS  The ventricles and sulci are normal for age. No intraparenchymal hemorrhage, mass effect nor midline shift. Patchy supratentorial white matter hypodensities are within normal range for patient's age and though non-specific suggest sequelae of chronic small vessel ischemic disease. No acute large vascular territory infarcts.  No abnormal extra-axial fluid collections. Cystic 5.5 x 4.5 cm extra-axial right middle cranial fossa mass insinuating along the sylvian fissure most consistent with arachnoid cyst. Basal cisterns are patent. Moderate calcific atherosclerosis of the carotid siphons.  No skull fracture. Visualized paranasal sinuses and mastoid aircells are well-aerated. The included ocular globes and orbital contents are non-suspicious. Soft tissue within the right external auditory canal may reflect cerumen.  CT CERVICAL SPINE FINDINGS  Cervical vertebral bodies and posterior elements are intact. Grade 1 C3-4 retrolisthesis on degenerative basis. Moderate to severe C3-4 and moderate C5-6 degenerative disc disease, mild at C4-5. No destructive bony lesions. Severe right C1-2 arthropathy. No destructive bony lesions. Included prevertebral and paraspinal soft tissues are nonsuspicious.  Degenerative disc disease and facet arthropathy result in moderate canal stenosis at C3-4. Moderate C3-4 and mild to moderate C5-6 neural foraminal narrowing.  IMPRESSION: CT head:  No acute intracranial process.  Right middle cranial fossa arachnoid cyst. Involutional changes. Mild white matter changes suggest chronic small vessel ischemic disease.  CT cervical spine: No acute fracture. Grade 1 C3-4 retrolisthesis on degenerative basis.   Electronically Signed   By: Courtnay  Bloomer   On: 09/07/2013 22:52   Ct Cervical Spine Wo Contrast  09/07/2013   CLINICAL DATA:  Fall, loss of consciousness.  EXAM: CT HEAD  WITHOUT CONTRAST  CT CERVICAL SPINE WITHOUT CONTRAST  TECHNIQUE: Multidetector CT imaging of the head and cervical spine was performed following the standard protocol without intravenous contrast. Multiplanar CT image reconstructions of the cervical spine were also generated.  COMPARISON:  CT of the sinus report dated December 09, 2001 though images are not available for direct comparison.  FINDINGS: CT HEAD FINDINGS  The ventricles and sulci are normal for age. No intraparenchymal hemorrhage, mass effect nor midline shift. Patchy supratentorial white matter hypodensities are within normal range for patient's age and though non-specific suggest sequelae of chronic small vessel ischemic disease. No acute large vascular territory infarcts.  No abnormal extra-axial fluid collections. Cystic 5.5 x 4.5 cm extra-axial right middle cranial fossa mass insinuating along the sylvian fissure most consistent with arachnoid cyst. Basal cisterns are patent. Moderate calcific atherosclerosis of the carotid siphons.  No skull fracture. Visualized paranasal sinuses and mastoid aircells   are well-aerated. The included ocular globes and orbital contents are non-suspicious. Soft tissue within the right external auditory canal may reflect cerumen.  CT CERVICAL SPINE FINDINGS  Cervical vertebral bodies and posterior elements are intact. Grade 1 C3-4 retrolisthesis on degenerative basis. Moderate to severe C3-4 and moderate C5-6 degenerative disc disease, mild at C4-5. No destructive bony lesions. Severe right C1-2 arthropathy. No destructive bony lesions. Included prevertebral and paraspinal soft tissues are nonsuspicious.  Degenerative disc disease and facet arthropathy result in moderate canal stenosis at C3-4. Moderate C3-4 and mild to moderate C5-6 neural foraminal narrowing.  IMPRESSION: CT head:  No acute intracranial process.  Right middle cranial fossa arachnoid cyst. Involutional changes. Mild white matter changes suggest chronic  small vessel ischemic disease.  CT cervical spine: No acute fracture. Grade 1 C3-4 retrolisthesis on degenerative basis.   Electronically Signed   By: Courtnay  Bloomer   On: 09/07/2013 22:52   Ct Abdomen Pelvis W Contrast  09/07/2013   CLINICAL DATA:  Fall, bruising to right abdomen  EXAM: CT ABDOMEN AND PELVIS WITH CONTRAST  TECHNIQUE: Multidetector CT imaging of the abdomen and pelvis was performed using the standard protocol following bolus administration of intravenous contrast.  CONTRAST:  100mL OMNIPAQUE IOHEXOL 300 MG/ML  SOLN  COMPARISON:  None.  FINDINGS: Lower Chest: Mild dependent atelectasis in the lower lobes. Visualized cardiac structures within normal limits for size. No pericardial effusion. Unremarkable visualized distal thoracic esophagus.  Abdomen: Unremarkable CT appearance of the stomach, duodenum, adrenal glands and pancreas. 4 mm circumscribed hypoattenuating lesion in the hepatic dome is too small to characterize but statistically likely a benign cyst. Nonspecific 5 mm low-attenuation lesion in hepatic segment 2. This is also too small to accurately characterize. Patient is status post cholecystectomy. No intra or extrahepatic biliary ductal dilatation. Borderline splenomegaly. The spleen likely remains within normal limits. No focal lesion. Unremarkable appearance of the bilateral kidneys. No focal solid lesion, hydronephrosis or nephrolithiasis.  Redundant sigmoid colon. Moderate volume of colonic stool suggests underlying constipation. Normal appendix in the right lower quadrant. Unremarkable terminal ileum. No bowel wall thickening or evidence of obstruction. Right inguinal hernia containing loops of unremarkable distal ileum. No evidence of hemo peritoneum or solid organ injury.  Pelvis: Unremarkable bladder, prostate gland and seminal vesicles. No free fluid or suspicious adenopathy.  Bones/Soft Tissues: No acute fracture or aggressive appearing lytic or blastic osseous lesion.  Multilevel degenerative disc disease in the lumbar spine most significant at L5-S1.  Vascular: No significant atherosclerotic vascular disease, aneurysmal dilatation or acute abnormality.  IMPRESSION: 1. No evidence of acute injury to the abdomen or pelvis. 2. Moderately large right inguinal hernia containing loops of unremarkable distal ileum. 3. Sub cm low-attenuation hepatic lesions. If the patient has no known history of primary malignancy, these are statistically highly likely to represent benign cysts. If there is a history of prior malignancy and metastatic disease is a consideration, recommend further evaluation with MRI of the abdomen with and without contrast in 3 months. 4. Multilevel degenerative disc disease most significant at L5-S1.   Electronically Signed   By: Heath  McCullough M.D.   On: 09/07/2013 23:15   Dg Knee Complete 4 Views Left  09/07/2013   CLINICAL DATA:  Fall with knee pain.  EXAM: LEFT KNEE - COMPLETE 4+ VIEW  COMPARISON:  None.  FINDINGS: No evidence of acute fracture, joint effusion, or malalignment. Tricompartmental osteoarthritis with marginal spurs and mild medial compartment narrowing. Irregularity of the fibular neck ventral cortex   is likely chronic/ incidental.  IMPRESSION: No acute osseous abnormality.   Electronically Signed   By: Jonathan  Watts M.D.   On: 09/07/2013 22:39   Dg Knee Complete 4 Views Right  09/07/2013   CLINICAL DATA:  Fall.  Knee pain.  EXAM: RIGHT KNEE - COMPLETE 4+ VIEW  COMPARISON:  None.  FINDINGS: No acute fracture or malalignment. No significant joint effusion. There is tricompartmental osteoarthritis with marginal spurring and moderate medial compartment narrowing.  IMPRESSION: 1. No acute osseous findings. 2. Tricompartmental osteoarthritis.   Electronically Signed   By: Jonathan  Watts M.D.   On: 09/07/2013 22:38    ASSESSMENT AND PLAN:  Principal Problem:   Syncope Active Problems:   Atrial fibrillation   Nonischemic cardiomyopathy    Chronic systolic heart failure   Hepatic lesion by CT   Sinus bradycardia   Aortic root dilatation  1. Syncope Unclear etiology  I agree with Dr Taylor that we need to investigate further with additional monitoring.  He has been monitored on telemetry without finding a cause for his syncope.  Additional monitoring with an implantable loop recorder is recommended. Risks, benefits, and alternatives to ILR placement were discussed at length with the patient who wishes to proceed.   I have informed the patient that he cannot drive for 6 months following his syncopal episode  2. afib Presently back in sinus Continue current medicines Goal INR 2-3 Will close outpatient INR monitoring  OK to discharge to home this am after ILR placement. Routine wound care (no shower for 48 hours) Follow-up in the [facility] device clinic in 10 days for a wound check Follow-up with Dr Taylor/ Dr Turner in the office.  Attila Mccarthy, MD 09/10/2013 7:24 AM  

## 2013-09-10 NOTE — Telephone Encounter (Signed)
Spoke with pt, aware there are no restrictions at all. Patient voiced understanding

## 2013-09-10 NOTE — Discharge Summary (Signed)
Physician Discharge Summary  Joseph Allison T Hegner ZOX:[id number] DOB: 08-06-1940 DOA: 09/07/2013  PCP: Quintella ReichertURNER,TRACI R, MD  Admit date: 09/07/2013 Discharge date: 09/10/2013  Time spent: Less than 30 minutes  Recommendations for Outpatient Follow-up:  1. Coumadin clinic on 09/15/13 at 8 AM for labs and followup 2. Cardiology on 09/21/13 at 12 noon for wound check 3. Dr. Lewayne BuntingGregg Taylor, EPS Cardiology 4. No driving for 6 months or until cleared by M.D.  Discharge Diagnoses:  Principal Problem:   Syncope Active Problems:   Atrial fibrillation   Nonischemic cardiomyopathy   Chronic systolic heart failure   Hepatic lesion by CT   Sinus bradycardia   Aortic root dilatation   Discharge Condition: Improved & Stable  Diet recommendation: Heart Healthy diet  Filed Weights   09/07/13 2040 09/08/13 0145  Weight: 83.008 kg (183 lb) 80.332 kg (177 lb 1.6 oz)    History of present illness:  73 year old male with history of PAF, failed DCCV 3/12 and started on Tikosyn, OSA on nightly CPAP, nonischemic cardiomyopathy with EF 45%, chronic thrombocytopenia, admitted on 3/31 following an episode of syncope on 3/30 without any premonitory symptoms. Cardiology has seen the patient and the patient has evidence of tachybradycardia syndrome. EPS consultation is pending.   Hospital Course:  1. Syncope x1: Unclear etiology. Patient has evidence of tachybradycardia on telemetry. Metoprolol has been held secondary to bradycardic episodes. Cardiology and EPS consulted. According to cardiology, telemetry without finding to explain his syncope. An implantable loop recorder was recommended and placed today. Patient and spouse have been counseled that he should not drive for 6 months or until cleared by his cardiologist and he verbalized understanding. CT head and cervical spine without acute findings. Carotid Dopplers showed 1-39% bilateral ICA stenosis. Cardiology will follow outpatient. 2. Chronic systolic  CHF/nonischemic cardiomyopathy with EF 45%: Compensated. 3. PAF: Currently in sinus rhythm with periodic A. fib with RVR. Continue Tikosyn and Coumadin. Management per cardiology. 4. OSA: Continue nightly CPAP 5. Chronic immune thrombocytopenia: Stable 6. Uncomplicated moderately large right inguinal hernia, seen on CT abdomen: Outpatient followup 7. Subcentimeter low-attenuation hepatic lesions on CT: Outpatient followup and workup as deemed necessary    Consultants:  Cardiology/EPS  Procedures:  Implantable loop recorder placement on 4/2   Discharge Exam:  Complaints:  Denies complaints  Filed Vitals:   09/09/13 1958 09/10/13 0017 09/10/13 0500 09/10/13 0821  BP: 121/68 109/61 105/64 124/65  Pulse: 63 132 98 62  Temp: 98.3 F (36.8 C)  97.4 F (36.3 C)   TempSrc: Oral  Oral   Resp: 16  18 16   Height:      Weight:      SpO2: 100% 98% 99% 99%    General exam: Pleasant elderly male ambulating in the halls. Respiratory system: Clear. No increased work of breathing.  Cardiovascular system: S1 & S2 heard, RRR. No JVD, murmurs, gallops, clicks or pedal edema. Telemetry: Sinus rhythm. Has periods of A. fib with RVR in 120's. Gastrointestinal system: Abdomen is nondistended, soft and nontender. Normal bowel sounds heard.  Central nervous system: Alert and oriented. No focal neurological deficits.  Extremities: Symmetric 5 x 5 power.  HEENT: Approximately 2 mm size superficial laceration over left upper occipital scalp without active bleeding.   Discharge Instructions      Discharge Orders   Future Appointments Provider Department Dept Phone   09/15/2013 8:00 AM [facility] [phone]   09/21/2013 12:00 PM [facility] [address] [address]  Office [phone]   12/22/2013 9:30 AM [facility] [phone]   12/22/2013 10:00 AM [name], MD [facility]  [facility] [phone]   Future Orders Complete By Expires   Call MD for:  extreme fatigue  As directed    Call MD for:  persistant dizziness or light-headedness  As directed    Call MD for:  As directed    Comments:     Passing out or feeling like passing out.   Diet - low sodium heart healthy  As directed    Driving Restrictions  As directed    Comments:     No driving for 6 months or until cleared by M.D.   Increase activity slowly  As directed        Medication List    STOP taking these medications       metoprolol succinate 25 MG 24 hr tablet  Commonly known as:  TOPROL-XL      TAKE these medications       acetaminophen 500 MG tablet  Commonly known as:  TYLENOL  Take 500 mg by mouth every 6 (six) hours as needed for mild pain.     CENTRUM SILVER PO  Take 1 tablet by mouth daily.     dofetilide 500 MCG capsule  Commonly known as:  TIKOSYN  Take 1 capsule (500 mcg total) by mouth 2 (two) times daily.     furosemide 20 MG tablet  Commonly known as:  LASIX  Take 1 tablet (20 mg total) by mouth daily.     Magnesium 250 MG Tabs  Take 1 tablet (250 mg total) by mouth 3 (three) times daily.     potassium chloride SA 20 MEQ tablet  Commonly known as:  K-DUR,KLOR-CON  Take 40 mEq by mouth daily.     ramipril 5 MG capsule  Commonly known as:  ALTACE  Take 5 mg by mouth daily.     spironolactone 25 MG tablet  Commonly known as:  ALDACTONE  Take 0.5 tablets (12.5 mg total) by mouth daily.     warfarin 1 MG tablet  Commonly known as:  COUMADIN  Take 1 mg by mouth daily. Takes 2 mg on Monday and 1 mg the rest of the week       Follow-up Information   Follow up with [facility] On 09/15/2013. (At 8:00 AM for Coumadin follow-up)    Contact information:   [address] [place] [address]       Follow up with [facility] Office On 09/21/2013. (At 12:00 noon for wound check)    Specialty:  Cardiology   Contact  information:   [address] [place] [address] [phone]       The results of significant diagnostics from this hospitalization (including imaging, microbiology, ancillary and laboratory) are listed below for reference.    Significant Diagnostic Studies: Dg Chest 2 View  09/07/2013   CLINICAL DATA:  Syncope.  Right anterior chest pain.  EXAM: CHEST  2 VIEW  COMPARISON:  05/17/2009  FINDINGS: Mild interstitial coarsening which is chronic. No edema or consolidation. No effusion or pneumothorax. Minimal opacity at the peripheral left base is likely scarring or atelectasis. This will be seen on abdominal CT which has already been ordered. Remote upper thoracic compression fracture.  IMPRESSION: 1. No edema or consolidation. 2. Minimal atelectasis or scar at  the left base.   Electronically Signed   By: Tiburcio Pea M.D.   On: 09/07/2013 22:36   Ct Head Wo Contrast  09/07/2013   CLINICAL DATA:  Fall, loss of consciousness.  EXAM: CT HEAD WITHOUT CONTRAST  CT CERVICAL SPINE WITHOUT CONTRAST  TECHNIQUE: Multidetector CT imaging of the head and cervical spine was performed following the standard protocol without intravenous contrast. Multiplanar CT image reconstructions of the cervical spine were also generated.  COMPARISON:  CT of the sinus report dated December 09, 2001 though images are not available for direct comparison.  FINDINGS: CT HEAD FINDINGS  The ventricles and sulci are normal for age. No intraparenchymal hemorrhage, mass effect nor midline shift. Patchy supratentorial white matter hypodensities are within normal range for patient's age and though non-specific suggest sequelae of chronic small vessel ischemic disease. No acute large vascular territory infarcts.  No abnormal extra-axial fluid collections. Cystic 5.5 x 4.5 cm extra-axial right middle cranial fossa mass insinuating along the sylvian fissure most consistent with arachnoid cyst. Basal cisterns are patent. Moderate  calcific atherosclerosis of the carotid siphons.  No skull fracture. Visualized paranasal sinuses and mastoid aircells are well-aerated. The included ocular globes and orbital contents are non-suspicious. Soft tissue within the right external auditory canal may reflect cerumen.  CT CERVICAL SPINE FINDINGS  Cervical vertebral bodies and posterior elements are intact. Grade 1 C3-4 retrolisthesis on degenerative basis. Moderate to severe C3-4 and moderate C5-6 degenerative disc disease, mild at C4-5. No destructive bony lesions. Severe right C1-2 arthropathy. No destructive bony lesions. Included prevertebral and paraspinal soft tissues are nonsuspicious.  Degenerative disc disease and facet arthropathy result in moderate canal stenosis at C3-4. Moderate C3-4 and mild to moderate C5-6 neural foraminal narrowing.  IMPRESSION: CT head:  No acute intracranial process.  Right middle cranial fossa arachnoid cyst. Involutional changes. Mild white matter changes suggest chronic small vessel ischemic disease.  CT cervical spine: No acute fracture. Grade 1 C3-4 retrolisthesis on degenerative basis.   Electronically Signed   By: Awilda Metro   On: 09/07/2013 22:52   Ct Cervical Spine Wo Contrast  09/07/2013   CLINICAL DATA:  Fall, loss of consciousness.  EXAM: CT HEAD WITHOUT CONTRAST  CT CERVICAL SPINE WITHOUT CONTRAST  TECHNIQUE: Multidetector CT imaging of the head and cervical spine was performed following the standard protocol without intravenous contrast. Multiplanar CT image reconstructions of the cervical spine were also generated.  COMPARISON:  CT of the sinus report dated December 09, 2001 though images are not available for direct comparison.  FINDINGS: CT HEAD FINDINGS  The ventricles and sulci are normal for age. No intraparenchymal hemorrhage, mass effect nor midline shift. Patchy supratentorial white matter hypodensities are within normal range for patient's age and though non-specific suggest sequelae of  chronic small vessel ischemic disease. No acute large vascular territory infarcts.  No abnormal extra-axial fluid collections. Cystic 5.5 x 4.5 cm extra-axial right middle cranial fossa mass insinuating along the sylvian fissure most consistent with arachnoid cyst. Basal cisterns are patent. Moderate calcific atherosclerosis of the carotid siphons.  No skull fracture. Visualized paranasal sinuses and mastoid aircells are well-aerated. The included ocular globes and orbital contents are non-suspicious. Soft tissue within the right external auditory canal may reflect cerumen.  CT CERVICAL SPINE FINDINGS  Cervical vertebral bodies and posterior elements are intact. Grade 1 C3-4 retrolisthesis on degenerative basis. Moderate to severe C3-4 and moderate C5-6 degenerative disc disease, mild at C4-5. No destructive bony lesions. Severe right C1-2  arthropathy. No destructive bony lesions. Included prevertebral and paraspinal soft tissues are nonsuspicious.  Degenerative disc disease and facet arthropathy result in moderate canal stenosis at C3-4. Moderate C3-4 and mild to moderate C5-6 neural foraminal narrowing.  IMPRESSION: CT head:  No acute intracranial process.  Right middle cranial fossa arachnoid cyst. Involutional changes. Mild white matter changes suggest chronic small vessel ischemic disease.  CT cervical spine: No acute fracture. Grade 1 C3-4 retrolisthesis on degenerative basis.   Electronically Signed   By: Awilda Metro   On: 09/07/2013 22:52   Ct Abdomen Pelvis W Contrast  09/07/2013   CLINICAL DATA:  Fall, bruising to right abdomen  EXAM: CT ABDOMEN AND PELVIS WITH CONTRAST  TECHNIQUE: Multidetector CT imaging of the abdomen and pelvis was performed using the standard protocol following bolus administration of intravenous contrast.  CONTRAST:  OMNIPAQUE IOHEXOL 300 MG/ML  SOLN  COMPARISON:  None.  FINDINGS: Lower Chest: Mild dependent atelectasis in the lower lobes. Visualized cardiac structures  within normal limits for size. No pericardial effusion. Unremarkable visualized distal thoracic esophagus.  Abdomen: Unremarkable CT appearance of the stomach, duodenum, adrenal glands and pancreas. 4 mm circumscribed hypoattenuating lesion in the hepatic dome is too small to characterize but statistically likely a benign cyst. Nonspecific 5 mm low-attenuation lesion in hepatic segment 2. This is also too small to accurately characterize. Patient is status post cholecystectomy. No intra or extrahepatic biliary ductal dilatation. Borderline splenomegaly. The spleen likely remains within normal limits. No focal lesion. Unremarkable appearance of the bilateral kidneys. No focal solid lesion, hydronephrosis or nephrolithiasis.  Redundant sigmoid colon. Moderate volume of colonic stool suggests underlying constipation. Normal appendix in the right lower quadrant. Unremarkable terminal ileum. No bowel wall thickening or evidence of obstruction. Right inguinal hernia containing loops of unremarkable distal ileum. No evidence of hemo peritoneum or solid organ injury.  Pelvis: Unremarkable bladder, prostate gland and seminal vesicles. No free fluid or suspicious adenopathy.  Bones/Soft Tissues: No acute fracture or aggressive appearing lytic or blastic osseous lesion. Multilevel degenerative disc disease in the lumbar spine most significant at L5-S1.  Vascular: No significant atherosclerotic vascular disease, aneurysmal dilatation or acute abnormality.  IMPRESSION: 1. No evidence of acute injury to the abdomen or pelvis. 2. Moderately large right inguinal hernia containing loops of unremarkable distal ileum. 3. Sub cm low-attenuation hepatic lesions. If the patient has no known history of primary malignancy, these are statistically highly likely to represent benign cysts. If there is a history of prior malignancy and metastatic disease is a consideration, recommend further evaluation with MRI of the abdomen with and without  contrast in 3 months. 4. Multilevel degenerative disc disease most significant at L5-S1.   Electronically Signed   By: Malachy Moan M.D.   On: 09/07/2013 23:15   Dg Knee Complete 4 Views Left  09/07/2013   CLINICAL DATA:  Fall with knee pain.  EXAM: LEFT KNEE - COMPLETE 4+ VIEW  COMPARISON:  None.  FINDINGS: No evidence of acute fracture, joint effusion, or malalignment. Tricompartmental osteoarthritis with marginal spurs and mild medial compartment narrowing. Irregularity of the fibular neck ventral cortex is likely chronic/ incidental.  IMPRESSION: No acute osseous abnormality.   Electronically Signed   By: Tiburcio Pea M.D.   On: 09/07/2013 22:39   Dg Knee Complete 4 Views Right  09/07/2013   CLINICAL DATA:  Fall.  Knee pain.  EXAM: RIGHT KNEE - COMPLETE 4+ VIEW  COMPARISON:  None.  FINDINGS: No acute fracture or  malalignment. No significant joint effusion. There is tricompartmental osteoarthritis with marginal spurring and moderate medial compartment narrowing.  IMPRESSION: 1. No acute osseous findings. 2. Tricompartmental osteoarthritis.   Electronically Signed   By: Tiburcio Pea M.D.   On: 09/07/2013 22:38    Microbiology: No results found for this or any previous visit (from the past 240 hour(s)).   Labs: Basic Metabolic Panel:  Recent Labs Lab 09/07/13 2108 09/08/13 1512 09/09/13 0530  NA 139  --  140  K 4.2  --  4.5  CL 98  --  100  CO2 28  --  28  GLUCOSE 156*  --  94  BUN 18  --  13  CREATININE 0.90  --  0.81  CALCIUM 9.3  --  8.7  MG  --  1.9  --    Liver Function Tests:  Recent Labs Lab 09/07/13 2108  AST 26  ALT 18  ALKPHOS 93  BILITOT 3.0*  PROT 6.9  ALBUMIN 4.2   No results found for this basename: LIPASE, AMYLASE,  in the last 168 hours No results found for this basename: AMMONIA,  in the last 168 hours CBC:  Recent Labs Lab 09/07/13 2108  WBC 8.9  NEUTROABS 6.9  HGB 15.5  HCT 43.0  MCV 94.1  PLT 133*   Cardiac Enzymes:  Recent  Labs Lab 09/07/13 2108 09/08/13 1512 09/08/13 2006 09/09/13 0520  TROPONINI <0.30 <0.30 <0.30 <0.30   BNP: BNP (last 3 results) No results found for this basename: PROBNP,  in the last 8760 hours CBG: No results found for this basename: GLUCAP,  in the last 168 hours  Additional labs:   TSH: 2.850 and free T4: 1.27  2-D echo 09/08/13: Study Conclusions  - Left ventricle: The cavity size was normal. Wall thickness was normal. Systolic function was normal. The estimated ejection fraction was in the range of 60% to 65%. Wall motion was normal; there were no regional wall motion abnormalities. Left ventricular diastolic function parameters were normal. - Aortic valve: Structurally normal valve. Trileaflet. Trivial regurgitation. - Aorta: Aortic root dimension: 42mm (ED). - Ascending aorta: The ascending aorta was mildly dilated. 4.1 cm. - Mitral valve: Mildly thickened leaflets . Mild regurgitation. - Left atrium: The atrium was mildly dilated. (21 cm2). - Right atrium: The atrium was mildly dilated. - Inferior vena cava: The vessel was normal in size; the respirophasic diameter changes were in the normal range (= 50%); findings are consistent with normal central venous pressure. - Pericardium, extracardiac: There was no pericardial effusion.      Signed:  Marcellus Scott, MD, FACP, FHM. [facility] Pager [phone]  If 7PM-7AM, please contact night-coverage [email or web address] Password [email or web address] 09/10/2013, 11:12 AM

## 2013-09-10 NOTE — Progress Notes (Signed)
Pt converted to Afib, HR 100-120's while pt was asleep, asymptomatic. BP109/61. EKG obtained for verification. Dr Mayford Knife notified. Will continue to monitor at this time, call back if HR increases or pt becomes symptomatic.

## 2013-09-10 NOTE — H&P (View-Only) (Signed)
SUBJECTIVE: The patient is doing well today.  At this time, he denies chest pain, shortness of breath, or any new concerns.  He converted to sinus rhythm over night without termination pause or dizziness.   [facility] HOLD] dofetilide  500 mcg Oral BID  . [facility] HOLD] furosemide  20 mg Oral Daily  . [facility] HOLD] magnesium oxide  400 mg Oral TID  . [MAR HOLD] potassium chloride SA  40 mEq Oral Daily  . [MAR HOLD] ramipril  5 mg Oral Daily  . [MAR HOLD] sodium chloride  3 mL Intravenous Q12H  . [facility] HOLD] spironolactone  12.5 mg Oral Daily  . [facility] HOLD] Warfarin - Pharmacist Dosing Inpatient   Does not apply q1800      OBJECTIVE: Physical Exam: Filed Vitals:   09/09/13 1502 09/09/13 1958 09/10/13 0017 09/10/13 0500  BP: 122/75 121/68 109/61 105/64  Pulse: 54 63 132 98  Temp: 97.3 F (36.3 C) 98.3 F (36.8 C)  97.4 F (36.3 C)  TempSrc: Oral Oral  Oral  Resp: 18 16  18   Height:      Weight:      SpO2: 100% 100% 98% 99%   No intake or output data in the 24 hours ending 09/10/13 0724  Telemetry reveals afib with conversion to sinus rhythm, no pauses/ brady events/ or ventricular arrhythmias  GEN- The patient is well appearing, alert and oriented x 3 today.   Head- normocephalic, atraumatic Eyes-  Sclera clear, conjunctiva pink Ears- hearing intact Oropharynx- clear Neck- supple, no JVP Lymph- no cervical lymphadenopathy Lungs- Clear to ausculation bilaterally, normal work of breathing Heart- Regular rate and rhythm  GI- soft, NT, ND, + BS Extremities- no clubbing, cyanosis, or edema Skin- no rash or lesion Psych- euthymic mood, full affect Neuro- strength and sensation are intact  LABS: Basic Metabolic Panel:  Recent Labs  16/10/96 2108 09/08/13 1512 09/09/13 0530  NA 139  --  140  K 4.2  --  4.5  CL 98  --  100  CO2 28  --  28  GLUCOSE 156*  --  94  BUN 18  --  13  CREATININE 0.90  --  0.81  CALCIUM 9.3  --  8.7  MG  --  1.9  --    Liver Function  Tests:  Recent Labs  09/07/13 2108  AST 26  ALT 18  ALKPHOS 93  BILITOT 3.0*  PROT 6.9  ALBUMIN 4.2   No results found for this basename: LIPASE, AMYLASE,  in the last 72 hours CBC:  Recent Labs  09/07/13 2108  WBC 8.9  NEUTROABS 6.9  HGB 15.5  HCT 43.0  MCV 94.1  PLT 133*   Cardiac Enzymes:  Recent Labs  09/08/13 1512 09/08/13 2006 09/09/13 0520  TROPONINI <0.30 <0.30 <0.30   BNP: No components found with this basename: POCBNP,  D-Dimer: No results found for this basename: DDIMER,  in the last 72 hours Hemoglobin A1C:  Recent Labs  09/09/13 0530  HGBA1C 4.8   Fasting Lipid Panel: No results found for this basename: CHOL, HDL, LDLCALC, TRIG, CHOLHDL, LDLDIRECT,  in the last 72 hours Thyroid Function Tests:  Recent Labs  09/08/13 1745  TSH 2.850   Anemia Panel: No results found for this basename: VITAMINB12, FOLATE, FERRITIN, TIBC, IRON, RETICCTPCT,  in the last 72 hours  RADIOLOGY: Dg Chest 2 View  09/07/2013   CLINICAL DATA:  Syncope.  Right anterior chest pain.  EXAM: CHEST  2  VIEW  COMPARISON:  05/17/2009  FINDINGS: Mild interstitial coarsening which is chronic. No edema or consolidation. No effusion or pneumothorax. Minimal opacity at the peripheral left base is likely scarring or atelectasis. This will be seen on abdominal CT which has already been ordered. Remote upper thoracic compression fracture.  IMPRESSION: 1. No edema or consolidation. 2. Minimal atelectasis or scar at the left base.   Electronically Signed   By: Tiburcio PeaJonathan  Watts M.D.   On: 09/07/2013 22:36   Ct Head Wo Contrast  09/07/2013   CLINICAL DATA:  Fall, loss of consciousness.  EXAM: CT HEAD WITHOUT CONTRAST  CT CERVICAL SPINE WITHOUT CONTRAST  TECHNIQUE: Multidetector CT imaging of the head and cervical spine was performed following the standard protocol without intravenous contrast. Multiplanar CT image reconstructions of the cervical spine were also generated.  COMPARISON:  CT of  the sinus report dated December 09, 2001 though images are not available for direct comparison.  FINDINGS: CT HEAD FINDINGS  The ventricles and sulci are normal for age. No intraparenchymal hemorrhage, mass effect nor midline shift. Patchy supratentorial white matter hypodensities are within normal range for patient's age and though non-specific suggest sequelae of chronic small vessel ischemic disease. No acute large vascular territory infarcts.  No abnormal extra-axial fluid collections. Cystic 5.5 x 4.5 cm extra-axial right middle cranial fossa mass insinuating along the sylvian fissure most consistent with arachnoid cyst. Basal cisterns are patent. Moderate calcific atherosclerosis of the carotid siphons.  No skull fracture. Visualized paranasal sinuses and mastoid aircells are well-aerated. The included ocular globes and orbital contents are non-suspicious. Soft tissue within the right external auditory canal may reflect cerumen.  CT CERVICAL SPINE FINDINGS  Cervical vertebral bodies and posterior elements are intact. Grade 1 C3-4 retrolisthesis on degenerative basis. Moderate to severe C3-4 and moderate C5-6 degenerative disc disease, mild at C4-5. No destructive bony lesions. Severe right C1-2 arthropathy. No destructive bony lesions. Included prevertebral and paraspinal soft tissues are nonsuspicious.  Degenerative disc disease and facet arthropathy result in moderate canal stenosis at C3-4. Moderate C3-4 and mild to moderate C5-6 neural foraminal narrowing.  IMPRESSION: CT head:  No acute intracranial process.  Right middle cranial fossa arachnoid cyst. Involutional changes. Mild white matter changes suggest chronic small vessel ischemic disease.  CT cervical spine: No acute fracture. Grade 1 C3-4 retrolisthesis on degenerative basis.   Electronically Signed   By: Awilda Metroourtnay  Bloomer   On: 09/07/2013 22:52   Ct Cervical Spine Wo Contrast  09/07/2013   CLINICAL DATA:  Fall, loss of consciousness.  EXAM: CT HEAD  WITHOUT CONTRAST  CT CERVICAL SPINE WITHOUT CONTRAST  TECHNIQUE: Multidetector CT imaging of the head and cervical spine was performed following the standard protocol without intravenous contrast. Multiplanar CT image reconstructions of the cervical spine were also generated.  COMPARISON:  CT of the sinus report dated December 09, 2001 though images are not available for direct comparison.  FINDINGS: CT HEAD FINDINGS  The ventricles and sulci are normal for age. No intraparenchymal hemorrhage, mass effect nor midline shift. Patchy supratentorial white matter hypodensities are within normal range for patient's age and though non-specific suggest sequelae of chronic small vessel ischemic disease. No acute large vascular territory infarcts.  No abnormal extra-axial fluid collections. Cystic 5.5 x 4.5 cm extra-axial right middle cranial fossa mass insinuating along the sylvian fissure most consistent with arachnoid cyst. Basal cisterns are patent. Moderate calcific atherosclerosis of the carotid siphons.  No skull fracture. Visualized paranasal sinuses and mastoid aircells  are well-aerated. The included ocular globes and orbital contents are non-suspicious. Soft tissue within the right external auditory canal may reflect cerumen.  CT CERVICAL SPINE FINDINGS  Cervical vertebral bodies and posterior elements are intact. Grade 1 C3-4 retrolisthesis on degenerative basis. Moderate to severe C3-4 and moderate C5-6 degenerative disc disease, mild at C4-5. No destructive bony lesions. Severe right C1-2 arthropathy. No destructive bony lesions. Included prevertebral and paraspinal soft tissues are nonsuspicious.  Degenerative disc disease and facet arthropathy result in moderate canal stenosis at C3-4. Moderate C3-4 and mild to moderate C5-6 neural foraminal narrowing.  IMPRESSION: CT head:  No acute intracranial process.  Right middle cranial fossa arachnoid cyst. Involutional changes. Mild white matter changes suggest chronic  small vessel ischemic disease.  CT cervical spine: No acute fracture. Grade 1 C3-4 retrolisthesis on degenerative basis.   Electronically Signed   By: Awilda Metro   On: 09/07/2013 22:52   Ct Abdomen Pelvis W Contrast  09/07/2013   CLINICAL DATA:  Fall, bruising to right abdomen  EXAM: CT ABDOMEN AND PELVIS WITH CONTRAST  TECHNIQUE: Multidetector CT imaging of the abdomen and pelvis was performed using the standard protocol following bolus administration of intravenous contrast.  CONTRAST:  OMNIPAQUE IOHEXOL 300 MG/ML  SOLN  COMPARISON:  None.  FINDINGS: Lower Chest: Mild dependent atelectasis in the lower lobes. Visualized cardiac structures within normal limits for size. No pericardial effusion. Unremarkable visualized distal thoracic esophagus.  Abdomen: Unremarkable CT appearance of the stomach, duodenum, adrenal glands and pancreas. 4 mm circumscribed hypoattenuating lesion in the hepatic dome is too small to characterize but statistically likely a benign cyst. Nonspecific 5 mm low-attenuation lesion in hepatic segment 2. This is also too small to accurately characterize. Patient is status post cholecystectomy. No intra or extrahepatic biliary ductal dilatation. Borderline splenomegaly. The spleen likely remains within normal limits. No focal lesion. Unremarkable appearance of the bilateral kidneys. No focal solid lesion, hydronephrosis or nephrolithiasis.  Redundant sigmoid colon. Moderate volume of colonic stool suggests underlying constipation. Normal appendix in the right lower quadrant. Unremarkable terminal ileum. No bowel wall thickening or evidence of obstruction. Right inguinal hernia containing loops of unremarkable distal ileum. No evidence of hemo peritoneum or solid organ injury.  Pelvis: Unremarkable bladder, prostate gland and seminal vesicles. No free fluid or suspicious adenopathy.  Bones/Soft Tissues: No acute fracture or aggressive appearing lytic or blastic osseous lesion.  Multilevel degenerative disc disease in the lumbar spine most significant at L5-S1.  Vascular: No significant atherosclerotic vascular disease, aneurysmal dilatation or acute abnormality.  IMPRESSION: 1. No evidence of acute injury to the abdomen or pelvis. 2. Moderately large right inguinal hernia containing loops of unremarkable distal ileum. 3. Sub cm low-attenuation hepatic lesions. If the patient has no known history of primary malignancy, these are statistically highly likely to represent benign cysts. If there is a history of prior malignancy and metastatic disease is a consideration, recommend further evaluation with MRI of the abdomen with and without contrast in 3 months. 4. Multilevel degenerative disc disease most significant at L5-S1.   Electronically Signed   By: Malachy Moan M.D.   On: 09/07/2013 23:15   Dg Knee Complete 4 Views Left  09/07/2013   CLINICAL DATA:  Fall with knee pain.  EXAM: LEFT KNEE - COMPLETE 4+ VIEW  COMPARISON:  None.  FINDINGS: No evidence of acute fracture, joint effusion, or malalignment. Tricompartmental osteoarthritis with marginal spurs and mild medial compartment narrowing. Irregularity of the fibular neck ventral cortex  is likely chronic/ incidental.  IMPRESSION: No acute osseous abnormality.   Electronically Signed   By: Tiburcio Pea M.D.   On: 09/07/2013 22:39   Dg Knee Complete 4 Views Right  09/07/2013   CLINICAL DATA:  Fall.  Knee pain.  EXAM: RIGHT KNEE - COMPLETE 4+ VIEW  COMPARISON:  None.  FINDINGS: No acute fracture or malalignment. No significant joint effusion. There is tricompartmental osteoarthritis with marginal spurring and moderate medial compartment narrowing.  IMPRESSION: 1. No acute osseous findings. 2. Tricompartmental osteoarthritis.   Electronically Signed   By: Tiburcio Pea M.D.   On: 09/07/2013 22:38    ASSESSMENT AND PLAN:  Principal Problem:   Syncope Active Problems:   Atrial fibrillation   Nonischemic cardiomyopathy    Chronic systolic heart failure   Hepatic lesion by CT   Sinus bradycardia   Aortic root dilatation  1. Syncope Unclear etiology  I agree with Dr Ladona Ridgel that we need to investigate further with additional monitoring.  He has been monitored on telemetry without finding a cause for his syncope.  Additional monitoring with an implantable loop recorder is recommended. Risks, benefits, and alternatives to ILR placement were discussed at length with the patient who wishes to proceed.   I have informed the patient that he cannot drive for 6 months following his syncopal episode  2. afib Presently back in sinus Continue current medicines Goal INR 2-3 Will close outpatient INR monitoring  OK to discharge to home this am after ILR placement. Routine wound care (no shower for 48 hours) Follow-up in the [facility] device clinic in 10 days for a wound check Follow-up with Dr Ladona Ridgel Dr Mayford Knife in the office.  Hillis Range, MD 09/10/2013 7:24 AM

## 2013-09-10 NOTE — Telephone Encounter (Signed)
New message    Pt had loop recorder put in this am.  He forgot to ask when can he return to work and if there is any restrictions?

## 2013-09-10 NOTE — Progress Notes (Signed)
Reviewed discharge instructions with patient and wife, they stated their understanding.  Loop recorder instructions reviewed and per Dr. Johney Frame, patient may shower in 48 hours.  Discharged home with wife via wheelchair by volunteers.  Joseph Allison

## 2013-09-10 NOTE — CV Procedure (Signed)
SURGEON:  Hillis Range, MD     PREPROCEDURE DIAGNOSIS:  Unexplained syncope    POSTPROCEDURE DIAGNOSIS:  Unexplained syncope     PROCEDURES:   1. Implantable loop recorder implantation    INTRODUCTION:  Joseph Allison is a 74 y.o. male with a history of unexplained syncope who presents today for implantable loop implantation.   The patient has afib as well las syncope.  Despite an extensive workup, the etiology for his syncope has not been identified.  he has worn telemetry in the hospital during which he did not have arrhythmias other than rate controlled afib.  There is significant concern for bradyarrhythmias as the cause for the patients syncope.  The patient therefore presents today for implantable loop implantation.     DESCRIPTION OF PROCEDURE:  Informed written consent was obtained, and the patient was brought to the electrophysiology lab in a fasting state.  The patient required no sedation for the procedure today.  Mapping over the patient's chest was performed by the EP lab staff to identify the area where electrograms were most prominent for ILR recording.  This area was found to be the left parasternal region over the 3rd-4th intercostal space. The patients left chest was therefore prepped and draped in the usual sterile fashion by the EP lab staff. The skin overlying the left parasternal region was infiltrated with lidocaine for local analgesia.  A 0.5-cm incision was made over the left parasternal region over the 3rd intercostal space.  A subcutaneous ILR pocket was fashioned using a combination of sharp and blunt dissection.  A Medtronic Reveal [place] model [id number] implantable loop recorder was then placed into the pocket  R waves were very prominent and measured 0.45mV.  Steri- Strips and a sterile dressing were then applied.  There were no early apparent complications.     CONCLUSIONS:   1. Successful implantation of a Medtronic Reveal LINQ implantable loop recorder for  unexplained syncope  2. No early apparent complications.

## 2013-09-10 NOTE — Progress Notes (Signed)
Pt converted to NSR, HR 62 around 0240. Pt remains asleep, asymptomatic.

## 2013-09-10 NOTE — Interval H&P Note (Signed)
History and Physical Interval Note:  09/10/2013 7:29 AM  Joseph Allison  has presented today for surgery, with the diagnosis of syncope  The various methods of treatment have been discussed with the patient and family. After consideration of risks, benefits and other options for treatment, the patient has consented to  Procedure(s): LOOP RECORDER IMPLANT (N/A) as a surgical intervention .  The patient's history has been reviewed, patient examined, no change in status, stable for surgery.  I have reviewed the patient's chart and labs.  Questions were answered to the patient's satisfaction.     Hillis Range

## 2013-09-10 NOTE — Discharge Instructions (Signed)
Supplemental Discharge Instructions following  Loop Recorder Insertion  NO DRIVING for 6 months until given clearance by Dr. Mayford Knife and/or Dr. Ladona Ridgel. WOUND CARE   Keep the wound area clean and dry.  Do not get this area wet for one week. No showers for one week; you may shower on 09/18/2013.   The tape/steri-strips on your wound will fall off; do not pull them off.  No bandage is needed on the site.  DO  NOT apply any creams, oils, or ointments to the wound area.   If you notice any drainage or discharge from the wound, any swelling or bruising at the site, or you develop a fever > 101? F after you are discharged home, call the office at once.  _____________________________________________________________________________________  Syncope Syncope is a fainting spell. This means the person loses consciousness and drops to the ground. The person is generally unconscious for less than 5 minutes. The person may have some muscle twitches for up to 15 seconds before waking up and returning to normal. Syncope occurs more often in elderly people, but it can happen to anyone. While most causes of syncope are not dangerous, syncope can be a sign of a serious medical problem. It is important to seek medical care.  CAUSES  Syncope is caused by a sudden decrease in blood flow to the brain. The specific cause is often not determined. Factors that can trigger syncope include:  Taking medicines that lower blood pressure.  Sudden changes in posture, such as standing up suddenly.  Taking more medicine than prescribed.  Standing in one place for too long.  Seizure disorders.  Dehydration and excessive exposure to heat.  Low blood sugar (hypoglycemia).  Straining to have a bowel movement.  Heart disease, irregular heartbeat, or other circulatory problems.  Fear, emotional distress, seeing blood, or severe pain. SYMPTOMS  Right before fainting, you may:  Feel dizzy or lightheaded.  Feel  nauseous.  See all white or all black in your field of vision.  Have cold, clammy skin. DIAGNOSIS  Your caregiver will ask about your symptoms, perform a physical exam, and perform electrocardiography (ECG) to record the electrical activity of your heart. Your caregiver may also perform other heart or blood tests to determine the cause of your syncope. TREATMENT  In most cases, no treatment is needed. Depending on the cause of your syncope, your caregiver may recommend changing or stopping some of your medicines. HOME CARE INSTRUCTIONS  Have someone stay with you until you feel stable.  Do not drive, operate machinery, or play sports until your caregiver says it is okay.  Keep all follow-up appointments as directed by your caregiver.  Lie down right away if you start feeling like you might faint. Breathe deeply and steadily. Wait until all the symptoms have passed.  Drink enough fluids to keep your urine clear or pale yellow.  If you are taking blood pressure or heart medicine, get up slowly, taking several minutes to sit and then stand. This can reduce dizziness. SEEK IMMEDIATE MEDICAL CARE IF:   You have a severe headache.  You have unusual pain in the chest, abdomen, or back.  You are bleeding from the mouth or rectum, or you have black or tarry stool.  You have an irregular or very fast heartbeat.  You have pain with breathing.  You have repeated fainting or seizure-like jerking during an episode.  You faint when sitting or lying down.  You have confusion.  You have difficulty  walking.  You have severe weakness.  You have vision problems. If you fainted, call your local emergency services (911 in [place] Do not drive yourself to the hospital.  MAKE SURE YOU:  Understand these instructions.  Will watch your condition.  Will get help right away if you are not doing well or get worse. Document Released: 05/28/2005 Document Revised: 11/27/2011 Document Reviewed:  07/27/2011 [facility] Patient Information 2014 [place], [place].    Atrial Fibrillation Atrial fibrillation is a condition that causes your heart to beat irregularly. It may also cause your heart to beat faster than normal. Atrial fibrillation can prevent your heart from pumping blood normally. It increases your risk of stroke and heart problems. HOME CARE  Take medications as told by your doctor.  Only take medications that your doctor says are safe. Some medications can make the condition worse or happen again.  If blood thinners were prescribed by your doctor, take them exactly as told. Too much can cause bleeding. Too little and you will not have the needed protection against stroke and other problems.  Perform blood tests at home if told by your doctor.  Perform blood tests exactly as told by your doctor.  Do not drink alcohol.  Do not drink beverages with caffeine such as coffee, soda, and some teas.  Maintain a healthy weight.  Do not use diet pills unless your doctor says they are safe. They may make heart problems worse.  Follow diet instructions as told by your doctor.  Exercise regularly as told by your doctor.  Keep all follow-up appointments. GET HELP RIGHT AWAY IF:   You have chest or belly (abdominal) pain.  You feel sick to your stomach (nauseous)  You suddenly have swollen feet and ankles.  You feel dizzy.  You face, arms, or legs feel numb or weak.  There is a change in your vision or speech.  You notice a change in the speed, rhythm, or strength of your heartbeat.  You suddenly begin peeing (urinating) more often.  You get tired more easily when moving or exercising. MAKE SURE YOU:   Understand these instructions.  Will watch your condition.  Will get help right away if you are not doing well or get worse. Document Released: 03/06/2008 Document Revised: 09/22/2012 Document Reviewed: 07/08/2012 [facility]Exit[facility] Patient Information 2014 [place]Care,  [place].

## 2013-09-11 ENCOUNTER — Telehealth: Payer: Self-pay

## 2013-09-11 MED ORDER — WARFARIN SODIUM 1 MG PO TABS: 1.0000 mg | ORAL_TABLET | Freq: Every day | ORAL | Status: DC

## 2013-09-11 NOTE — Telephone Encounter (Signed)
Refill sent.

## 2013-09-14 ENCOUNTER — Other Ambulatory Visit: Payer: Self-pay | Admitting: *Deleted

## 2013-09-14 ENCOUNTER — Telehealth: Payer: Self-pay | Admitting: *Deleted

## 2013-09-14 MED ORDER — WARFARIN SODIUM 1 MG PO TABS: 1.0000 mg | ORAL_TABLET | Freq: Every day | ORAL | Status: DC

## 2013-09-14 NOTE — Telephone Encounter (Signed)
This script for warfarin has been taken care of

## 2013-09-14 NOTE — Telephone Encounter (Signed)
Patient calling to have Warfarin sent to [facility] not [facility]. He would like to have the [facility] order deleted from order. I called [facility], they have not received the order yet but did mark his account to not refill this order. I called Joseph Allison back to let him know they will mark the account as "do not refill".

## 2013-09-15 ENCOUNTER — Ambulatory Visit (INDEPENDENT_AMBULATORY_CARE_PROVIDER_SITE_OTHER): Payer: Managed Care, Other (non HMO) | Admitting: Pharmacist Clinician (PhC)/ Clinical Pharmacy Specialist

## 2013-09-15 DIAGNOSIS — I4891 Unspecified atrial fibrillation: Secondary | ICD-10-CM

## 2013-09-15 DIAGNOSIS — Z5181 Encounter for therapeutic drug level monitoring: Secondary | ICD-10-CM

## 2013-09-15 LAB — POCT INR: INR: 2.9

## 2013-09-21 ENCOUNTER — Ambulatory Visit (INDEPENDENT_AMBULATORY_CARE_PROVIDER_SITE_OTHER): Payer: Managed Care, Other (non HMO) | Admitting: *Deleted

## 2013-09-21 ENCOUNTER — Encounter: Payer: Self-pay | Admitting: Internal Medicine

## 2013-09-21 DIAGNOSIS — R55 Syncope and collapse: Secondary | ICD-10-CM

## 2013-09-21 LAB — MDC_IDC_ENUM_SESS_TYPE_INCLINIC

## 2013-09-21 NOTE — Progress Notes (Signed)
Loop wound  check in clinic. Steri strips removed, wound without redness or edema.  3 Symptoms recorded episodes for A-fib.  11A-fib episodes noted.  No syncopal episodes noted.  ROV 3 months with Dr. Johney Frame.

## 2013-09-22 NOTE — Telephone Encounter (Signed)
OK to return to work with no heavy lifting more than 10 pounds for a week and no driving for 6 months

## 2013-09-22 NOTE — Telephone Encounter (Signed)
New Message  Pt requests a call back to determine if he can resume his full schedule at work. Please call back to discuss.

## 2013-09-22 NOTE — Telephone Encounter (Signed)
LVM for pt to return call

## 2013-09-22 NOTE — Telephone Encounter (Signed)
What kind of work does he do?

## 2013-09-22 NOTE — Telephone Encounter (Signed)
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 Pt does do some lifting does he have any restrictions. Also pts wife stated that the last time he mowed the lawn that caused him to go into AFIB due to the strenuous activity. Is he ok to [employer]?

## 2013-09-22 NOTE — Telephone Encounter (Signed)
TO Dr Mayford Knife, is pt ok to resume his full schedule at work?

## 2013-09-22 NOTE — Telephone Encounter (Signed)
Follow up ° ° ° ° ° °Returning Joseph Allison's call °

## 2013-09-23 NOTE — Telephone Encounter (Signed)
Made pt aware

## 2013-09-24 ENCOUNTER — Other Ambulatory Visit: Payer: Self-pay | Admitting: Cardiology

## 2013-09-25 ENCOUNTER — Encounter: Payer: Self-pay | Admitting: Internal Medicine

## 2013-10-06 ENCOUNTER — Ambulatory Visit (INDEPENDENT_AMBULATORY_CARE_PROVIDER_SITE_OTHER): Payer: Managed Care, Other (non HMO) | Admitting: Pharmacist Clinician (PhC)/ Clinical Pharmacy Specialist

## 2013-10-06 DIAGNOSIS — I4891 Unspecified atrial fibrillation: Secondary | ICD-10-CM

## 2013-10-06 DIAGNOSIS — Z5181 Encounter for therapeutic drug level monitoring: Secondary | ICD-10-CM

## 2013-10-06 LAB — POCT INR: INR: 2.2

## 2013-10-09 ENCOUNTER — Ambulatory Visit (INDEPENDENT_AMBULATORY_CARE_PROVIDER_SITE_OTHER): Payer: Managed Care, Other (non HMO) | Admitting: *Deleted

## 2013-10-09 DIAGNOSIS — R55 Syncope and collapse: Secondary | ICD-10-CM

## 2013-10-13 ENCOUNTER — Encounter: Payer: Self-pay | Admitting: Internal Medicine

## 2013-10-26 ENCOUNTER — Telehealth: Payer: Self-pay | Admitting: Cardiology

## 2013-10-26 ENCOUNTER — Encounter: Payer: Self-pay | Admitting: General Surgery

## 2013-10-26 NOTE — Telephone Encounter (Signed)
Ok to return to work but no driving for 6 months

## 2013-10-26 NOTE — Telephone Encounter (Signed)
To Dr Mayford Knife to ok.

## 2013-10-26 NOTE — Telephone Encounter (Signed)
Printed for Dr Mayford Knife to sign and will fax for pt

## 2013-10-26 NOTE — Telephone Encounter (Signed)
Patient has been back to work since 09/21/13 and he needs a note stating that its okay for him to be working and no restrictions. Please fax note over to [phone], if any questions please call patient at [phone]

## 2013-10-26 NOTE — Telephone Encounter (Signed)
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 Pt denies any problems since being at work. Pt is retiring in July.

## 2013-10-26 NOTE — Telephone Encounter (Signed)
Patient has been back to work since 09/21/2013, notes needs a note stating its okay for him to come back and with no restrictions. It needs to be back dated to 09/21/13. It can be faxed over to [phone]. Any questions please call patient @ [phone].

## 2013-10-26 NOTE — Telephone Encounter (Signed)
What kind of work does he do?

## 2013-10-29 ENCOUNTER — Encounter: Payer: Self-pay | Admitting: Internal Medicine

## 2013-10-29 ENCOUNTER — Ambulatory Visit (INDEPENDENT_AMBULATORY_CARE_PROVIDER_SITE_OTHER): Payer: Managed Care, Other (non HMO) | Admitting: Internal Medicine

## 2013-10-29 VITALS — BP 114/65 | HR 48 | Ht 70.5 in | Wt 178.0 lb

## 2013-10-29 DIAGNOSIS — R55 Syncope and collapse: Secondary | ICD-10-CM

## 2013-10-29 DIAGNOSIS — I498 Other specified cardiac arrhythmias: Secondary | ICD-10-CM

## 2013-10-29 DIAGNOSIS — I4891 Unspecified atrial fibrillation: Secondary | ICD-10-CM

## 2013-10-29 DIAGNOSIS — R001 Bradycardia, unspecified: Secondary | ICD-10-CM

## 2013-10-29 LAB — MDC_IDC_ENUM_SESS_TYPE_INCLINIC

## 2013-10-29 NOTE — Patient Instructions (Addendum)
Please record symptoms as they occur with your "Patient activator".  Your physician recommends that you schedule a follow-up appointment in:  6 months with Dr Johney Frame

## 2013-10-29 NOTE — Progress Notes (Signed)
Primary Cardiologist:  Quintella Reichert, MD  The patient presents today for routine electrophysiology followup.  Since hospital discharge, the patient reports doing very well.  He has had no further syncope.  He is unaware of afib.  Today, he denies symptoms of palpitations, chest pain, shortness of breath, orthopnea, PND, lower extremity edema, dizziness, presyncope, or neurologic sequela.  The patient feels that he is tolerating medications without difficulties and is otherwise without complaint today.   Past Medical History  Diagnosis Date  . Thrombocytopenia     a. Followed by Dr. Myna Hidalgo - chronic immune thrombocytopenia.   Marland Kitchen PAF (paroxysmal atrial fibrillation)     a. S/p prior DCCVs. b. Failed DCCV 08/2010 - started on Tikosyn 12/2010.  . Asthma   . NICM (nonischemic cardiomyopathy)     a. Presumed tachycardia mediated EF 45% in 11/2010. b. Nuc stress test 06/2010: no ischemia. One note suggests the patient had a nuc 08/2010 with no ischemia & EF 32% but cannot find evidence of this.  . OSA (obstructive sleep apnea)     moderate on BIPAP at 19/15cm H2O  . Inguinal hernia   . Sinus bradycardia   . Syncope   . Encounter for long-term (current) use of other medications   . Long term (current) use of anticoagulants   . Arrhythmia 2012    new a fib  . Diverticulosis 2010    Per colonoscopy  . Cholelithiasis     cholecystectomy 05/2010 per Dr. Andrey Campanile  . AK (actinic keratosis)     followed by derm   Past Surgical History  Procedure Laterality Date  . Cholecystectomy    . Loop recorder implant  09/10/13    MDT LinQ implanted by Dr Johney Frame for syncope  . Cardioversion  08/2010, 12/2010    A fib, both failed    Current Outpatient Prescriptions  Medication Sig Dispense Refill  . dofetilide (TIKOSYN) 500 MCG capsule Take 1 capsule (500 mcg total) by mouth 2 (two) times daily.  180 capsule  0  . furosemide (LASIX) 20 MG tablet Take 1 tablet (20 mg total) by mouth daily.  30 tablet  9  .  Magnesium 250 MG TABS Take 1 tablet (250 mg total) by mouth 3 (three) times daily.  270 tablet  2  . Multiple Vitamins-Minerals (CENTRUM SILVER PO) Take 1 tablet by mouth daily.        . potassium chloride SA (K-DUR,KLOR-CON) 20 MEQ tablet Take 40 mEq by mouth daily.       . ramipril (ALTACE) 5 MG capsule Take 5 mg by mouth daily.      Marland Kitchen spironolactone (ALDACTONE) 25 MG tablet Take 0.5 tablets (12.5 mg total) by mouth daily.  45 tablet  3  . warfarin (COUMADIN) 1 MG tablet Takes 2 mg on Monday and 1 mg the rest of the week       No current facility-administered medications for this visit.    No Known Allergies  History   Social History  . Marital Status: Married    Spouse Name: N/A    Number of Children: N/A  . Years of Education: N/A   Occupational History  . Not on file.   Social History Main Topics  . Smoking status: Former Smoker    Quit date: 04/24/1983  . Smokeless tobacco: Never Used  . Alcohol Use: No  . Drug Use: No  . Sexual Activity: Not Currently   Other Topics Concern  . Not on file  Social History Narrative  . No narrative on file    Family History  Problem Relation Age of Onset  . Other      Father died in WWII  . Colon cancer Mother   . Breast cancer Mother   . Cervical cancer Sister     ROS-  All systems are reviewed and are negative except as outlined in the HPI above  Physical Exam: Filed Vitals:   10/29/13 0846  BP: 114/65  Pulse: 48  Height: 5' 10.5" (1.791 m)  Weight: 178 lb (80.74 kg)    GEN- The patient is well appearing, alert and oriented x 3 today.   Head- normocephalic, atraumatic Eyes-  Sclera clear, conjunctiva pink Ears- hearing intact Oropharynx- clear Neck- supple, no JVP Lymph- no cervical lymphadenopathy Lungs- Clear to ausculation bilaterally, normal work of breathing Heart- Regular rate and rhythm, no murmurs, rubs or gallops, PMI not laterally displaced GI- soft, NT, ND, + BS Extremities- no clubbing, cyanosis,  or edema MS- no significant deformity or atrophy Skin- no rash or lesion Psych- euthymic mood, full affect Neuro- strength and sensation are   ILR interrogation is reviewed ekg today reviewed and reveals sinus rhythm 48 bpm, otherwise normal ekg  Assessment and Plan:  1. Syncope No recurrence ILR today reveals no brady or significant tachy events Adequate hydration is encouraged  2. afib 11% afib by ILR V rates are reasonably controlled He is asymptomatic with his afib and wishes to continue tikosyn at this time. We did discuss amiodarone and ablation as alternatives.  He is aware that I would defer these to symptoms which he does not appear to be having presently. Continue coumadin  Return to see me in 6 months Follow-up with Dr Mayford Knifeurner as scheduled

## 2013-10-30 ENCOUNTER — Encounter: Payer: Self-pay | Admitting: Internal Medicine

## 2013-11-04 ENCOUNTER — Encounter: Payer: Self-pay | Admitting: Internal Medicine

## 2013-11-06 ENCOUNTER — Ambulatory Visit (INDEPENDENT_AMBULATORY_CARE_PROVIDER_SITE_OTHER): Payer: Managed Care, Other (non HMO) | Admitting: Surgery

## 2013-11-06 DIAGNOSIS — I4891 Unspecified atrial fibrillation: Secondary | ICD-10-CM

## 2013-11-06 DIAGNOSIS — Z5181 Encounter for therapeutic drug level monitoring: Secondary | ICD-10-CM

## 2013-11-06 LAB — POCT INR: INR: 2.1

## 2013-11-11 ENCOUNTER — Other Ambulatory Visit: Payer: Self-pay | Admitting: *Deleted

## 2013-11-11 ENCOUNTER — Ambulatory Visit (INDEPENDENT_AMBULATORY_CARE_PROVIDER_SITE_OTHER): Payer: Managed Care, Other (non HMO)

## 2013-11-11 ENCOUNTER — Telehealth: Payer: Self-pay | Admitting: *Deleted

## 2013-11-11 DIAGNOSIS — R55 Syncope and collapse: Secondary | ICD-10-CM

## 2013-11-11 MED ORDER — RAMIPRIL 5 MG PO CAPS: 5.0000 mg | ORAL_CAPSULE | Freq: Every day | ORAL | Status: DC

## 2013-11-11 NOTE — Telephone Encounter (Signed)
I  DID NOT  CALL PT   WILL  FORWARD TO   COUMADIN   CLINIC./CY

## 2013-11-11 NOTE — Telephone Encounter (Signed)
Patient stated that he was returning your call, did you call him? If not it could have been Belenda Cruise from the coumadin clinic.

## 2013-11-11 NOTE — Telephone Encounter (Signed)
I don't recall calling anyone by this name, anybody else recognize it?  Joseph Allison

## 2013-11-12 NOTE — Telephone Encounter (Signed)
Belenda Cruise, did you call patient?

## 2013-11-16 ENCOUNTER — Telehealth: Payer: Self-pay | Admitting: Hematology & Oncology

## 2013-11-16 NOTE — Telephone Encounter (Signed)
Pt moved 7-14 to 6-25 he is moving

## 2013-11-16 NOTE — Telephone Encounter (Signed)
Pt phone call returned in regards to pause episode on 5-31 around 646. No symptoms at time of episode. Pt was sleeping.

## 2013-11-17 ENCOUNTER — Encounter: Payer: Self-pay | Admitting: Internal Medicine

## 2013-11-25 ENCOUNTER — Encounter: Payer: Self-pay | Admitting: Internal Medicine

## 2013-11-26 ENCOUNTER — Other Ambulatory Visit: Payer: Self-pay | Admitting: *Deleted

## 2013-11-26 MED ORDER — DOFETILIDE 500 MCG PO CAPS: 500.0000 ug | ORAL_CAPSULE | Freq: Two times a day (BID) | ORAL | Status: DC

## 2013-11-26 NOTE — Telephone Encounter (Signed)
Pharmacy called for refill. Verbal given.

## 2013-11-30 LAB — MDC_IDC_ENUM_SESS_TYPE_REMOTE
MDC IDC SESS DTM: 20150502231643
MDC IDC SET ZONE DETECTION INTERVAL: 2000 ms
MDC IDC SET ZONE DETECTION INTERVAL: 3000 ms
MDC IDC SET ZONE DETECTION INTERVAL: 380 ms

## 2013-12-02 ENCOUNTER — Telehealth: Payer: Self-pay | Admitting: Internal Medicine

## 2013-12-02 NOTE — Telephone Encounter (Signed)
New message           Pt would like to to know the range on his medtronik device / pt will be out of town for a while

## 2013-12-02 NOTE — Telephone Encounter (Signed)
Answered all questions. Pt is moving next week to [place]. Pt will probably transfer care to cardiologist in [place] area but pt will call back to let know where to transfer [facility].

## 2013-12-03 ENCOUNTER — Ambulatory Visit (HOSPITAL_BASED_OUTPATIENT_CLINIC_OR_DEPARTMENT_OTHER): Payer: Managed Care, Other (non HMO) | Admitting: Hematology & Oncology

## 2013-12-03 ENCOUNTER — Other Ambulatory Visit (HOSPITAL_BASED_OUTPATIENT_CLINIC_OR_DEPARTMENT_OTHER): Payer: Managed Care, Other (non HMO) | Admitting: Lab

## 2013-12-03 ENCOUNTER — Encounter: Payer: Self-pay | Admitting: Hematology & Oncology

## 2013-12-03 VITALS — BP 95/61 | HR 52 | Temp 97.7°F | Resp 18 | Ht 71.0 in | Wt 181.0 lb

## 2013-12-03 DIAGNOSIS — D696 Thrombocytopenia, unspecified: Secondary | ICD-10-CM

## 2013-12-03 DIAGNOSIS — I482 Chronic atrial fibrillation, unspecified: Secondary | ICD-10-CM

## 2013-12-03 DIAGNOSIS — Z7901 Long term (current) use of anticoagulants: Secondary | ICD-10-CM

## 2013-12-03 DIAGNOSIS — D693 Immune thrombocytopenic purpura: Secondary | ICD-10-CM

## 2013-12-03 LAB — CBC WITH DIFFERENTIAL (CANCER CENTER ONLY)
BASO#: 0 10*3/uL (ref 0.0–0.2)
BASO%: 0.5 % (ref 0.0–2.0)
EOS%: 1.3 % (ref 0.0–7.0)
Eosinophils Absolute: 0.1 10*3/uL (ref 0.0–0.5)
HCT: 44.1 % (ref 38.7–49.9)
HGB: 15.8 g/dL (ref 13.0–17.1)
LYMPH#: 1.2 10*3/uL (ref 0.9–3.3)
LYMPH%: 20.2 % (ref 14.0–48.0)
MCH: 33.5 pg — ABNORMAL HIGH (ref 28.0–33.4)
MCHC: 35.8 g/dL (ref 32.0–35.9)
MCV: 94 fL (ref 82–98)
MONO#: 0.5 10*3/uL (ref 0.1–0.9)
MONO%: 7.4 % (ref 0.0–13.0)
NEUT#: 4.3 10*3/uL (ref 1.5–6.5)
NEUT%: 70.6 % (ref 40.0–80.0)
PLATELETS: 101 10*3/uL — AB (ref 145–400)
RBC: 4.71 10*6/uL (ref 4.20–5.70)
RDW: 15.3 % (ref 11.1–15.7)
WBC: 6.1 10*3/uL (ref 4.0–10.0)

## 2013-12-03 LAB — CHCC SATELLITE - SMEAR

## 2013-12-03 NOTE — Progress Notes (Signed)
Hematology and Oncology Follow Up Visit  Joseph Allison [id number] 1941/01/12 73 y.o. 12/03/2013   Principle Diagnosis:  Chronic immune thrombocytopenia.  Current Therapy:   Observation    Interim History:  Mr.  Joseph Allison is for followup. He's doing fairly well. She will be retiring this week. 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 He and his wife are moving out to [place]. A daughter will be have a grandchild. He and his wife we had to move out there and in one week.  He's had no problems with bleeding.  He does have a Holter monitor on. This is for his heart. He's had this done since April. He apparently passed out at home. Currently is on Coumadin. He has had no bleeding with the Coumadin.  Has been no bleeding. He's had no leg swelling. He's had no change in bowel or bladder habits.  Medications: Current outpatient prescriptions:dofetilide (TIKOSYN) 500 MCG capsule, Take 1 capsule (500 mcg total) by mouth 2 (two) times daily., Disp: 180 capsule, Rfl: 1;  furosemide (LASIX) 20 MG tablet, Take 1 tablet (20 mg total) by mouth daily., Disp: 30 tablet, Rfl: 9;  Magnesium 250 MG TABS, Take 1 tablet (250 mg total) by mouth 3 (three) times daily., Disp: 270 tablet, Rfl: 2 Multiple Vitamins-Minerals (CENTRUM SILVER PO), Take 1 tablet by mouth daily.  , Disp: , Rfl: ;  potassium chloride SA (K-DUR,KLOR-CON) 20 MEQ tablet, Take 40 mEq by mouth daily. , Disp: , Rfl: ;  ramipril (ALTACE) 5 MG capsule, Take 1 capsule (5 mg total) by mouth daily., Disp: 30 capsule, Rfl: 0;  spironolactone (ALDACTONE) 25 MG tablet, Take 0.5 tablets (12.5 mg total) by mouth daily., Disp: 45 tablet, Rfl: 3 warfarin (COUMADIN) 1 MG tablet, Takes 2 mg on Monday and 1 mg the rest of the week, Disp: , Rfl:   Allergies: No Known Allergies  Past Medical History, Surgical history, Social history, and Family History were reviewed and updated.  Review of Systems: As above  Physical Exam:  height is 5\' 11"   (1.803 m) and weight is 181 lb (82.101 kg). His oral temperature is 97.7 F (36.5 C). His blood pressure is 95/61 and his pulse is 52. His respiration is 18.   Well-developed and well-nourished gentleman. Lungs are clear. Cardiac exam regular rate and rhythm with no murmurs rubs or bruits. Abdomen is soft. Has good bowel sounds. There is no fluid wave. There is no palpable liver or spleen tip. Back exam no tenderness over the spine ribs or hips. Extremities shows no clubbing cyanosis or edema. Neurological exam shows no focal neurological deficits. Skin exam no rashes ecchymoses or petechia.  Lab Results  Component Value Date   WBC 6.1 12/03/2013   HGB 15.8 12/03/2013   HCT 44.1 12/03/2013   MCV 94 12/03/2013   PLT 101* 12/03/2013     Chemistry      Component Value Date/Time   NA 140 09/09/2013 0530   K 4.5 09/09/2013 0530   CL 100 09/09/2013 0530   CO2 28 09/09/2013 0530   BUN 13 09/09/2013 0530   CREATININE 0.81 09/09/2013 0530      Component Value Date/Time   CALCIUM 8.7 09/09/2013 0530   ALKPHOS 93 09/07/2013 2108   AST 26 09/07/2013 2108   ALT 18 09/07/2013 2108   BILITOT 3.0* 09/07/2013 2108         Impression and Plan: Mr. Joseph Allison is a 73 year old gentleman with polycythemia. This is probably mild  chronic immune thrombocytopenia.  For now, they're still nothing that we have to do. His platelet count is not low enough that'll cause him any problems. I do not see any issues with him being on Coumadin while have the thrombocytopenia.  We will see about finding a hematologist for him out in [place]. There is no be much of a problem with the finding 1.  It certainly was a pleasure to see Joseph Allison. He is a good person. I will add that he would go to retired.   Joseph Macho, MD 6/25/201510:05 AM

## 2013-12-04 ENCOUNTER — Telehealth: Payer: Self-pay | Admitting: *Deleted

## 2013-12-04 ENCOUNTER — Other Ambulatory Visit: Payer: Self-pay | Admitting: *Deleted

## 2013-12-04 MED ORDER — RAMIPRIL 5 MG PO CAPS: 5.0000 mg | ORAL_CAPSULE | Freq: Every day | ORAL | Status: DC

## 2013-12-04 NOTE — Telephone Encounter (Signed)
Patient requests ramipril refill, but he was due to see Dr Mayford Knife in May and he did not do so. He did see Dr Johney Frame in May though. He stated that he is in the process of moving to [facility] and would really like this refilled. Ok to refill or does he need to schedule appointment first? If ok, how many refills? Please advise. Thanks, MI

## 2013-12-04 NOTE — Telephone Encounter (Signed)
Patient is aware to establish with new cardiologist when he moves for further refills. He plans to see Dr Mayford Knife on more time before he changes cardiologists

## 2013-12-04 NOTE — Telephone Encounter (Signed)
OK to refill since she saw allred. Give 6 months. Make sure he knows if he is getting a new cardiologist when he moves they will be responsible for next refill. Thanks

## 2013-12-07 ENCOUNTER — Encounter: Payer: Managed Care, Other (non HMO) | Admitting: *Deleted

## 2013-12-07 ENCOUNTER — Ambulatory Visit (INDEPENDENT_AMBULATORY_CARE_PROVIDER_SITE_OTHER): Payer: Managed Care, Other (non HMO) | Admitting: *Deleted

## 2013-12-07 DIAGNOSIS — R55 Syncope and collapse: Secondary | ICD-10-CM

## 2013-12-07 LAB — MDC_IDC_ENUM_SESS_TYPE_REMOTE: Date Time Interrogation Session: 20150627040500

## 2013-12-08 ENCOUNTER — Telehealth: Payer: Self-pay | Admitting: Cardiology

## 2013-12-08 ENCOUNTER — Ambulatory Visit (INDEPENDENT_AMBULATORY_CARE_PROVIDER_SITE_OTHER): Payer: Managed Care, Other (non HMO) | Admitting: Pharmacist Clinician (PhC)/ Clinical Pharmacy Specialist

## 2013-12-08 DIAGNOSIS — I4891 Unspecified atrial fibrillation: Secondary | ICD-10-CM

## 2013-12-08 DIAGNOSIS — Z5181 Encounter for therapeutic drug level monitoring: Secondary | ICD-10-CM

## 2013-12-08 LAB — POCT INR: INR: 2

## 2013-12-08 NOTE — Telephone Encounter (Signed)
FMLA papers Mailed To pt New home Address [address] [place] [address] [id number]/km

## 2013-12-15 NOTE — Progress Notes (Signed)
Loop recorder 

## 2013-12-16 ENCOUNTER — Encounter: Payer: Self-pay | Admitting: Internal Medicine

## 2013-12-22 ENCOUNTER — Ambulatory Visit: Payer: Managed Care, Other (non HMO) | Admitting: Hematology & Oncology

## 2013-12-22 ENCOUNTER — Other Ambulatory Visit: Payer: Managed Care, Other (non HMO) | Admitting: Lab

## 2013-12-25 ENCOUNTER — Encounter: Payer: Self-pay | Admitting: Internal Medicine

## 2014-01-01 ENCOUNTER — Encounter: Payer: Self-pay | Admitting: Internal Medicine

## 2014-01-07 ENCOUNTER — Ambulatory Visit (INDEPENDENT_AMBULATORY_CARE_PROVIDER_SITE_OTHER): Payer: Medicare Other | Admitting: Pharmacist

## 2014-01-07 DIAGNOSIS — Z5181 Encounter for therapeutic drug level monitoring: Secondary | ICD-10-CM

## 2014-01-07 DIAGNOSIS — I4891 Unspecified atrial fibrillation: Secondary | ICD-10-CM | POA: Diagnosis not present

## 2014-01-07 LAB — POCT INR: INR: 2.1

## 2014-01-08 ENCOUNTER — Encounter: Payer: Self-pay | Admitting: Internal Medicine

## 2014-01-11 ENCOUNTER — Ambulatory Visit (INDEPENDENT_AMBULATORY_CARE_PROVIDER_SITE_OTHER): Payer: Medicare Other | Admitting: *Deleted

## 2014-01-11 DIAGNOSIS — R55 Syncope and collapse: Secondary | ICD-10-CM

## 2014-01-12 ENCOUNTER — Telehealth: Payer: Self-pay | Admitting: Internal Medicine

## 2014-01-12 NOTE — Telephone Encounter (Signed)
Follow up     Pt want to know if we will refer him to see Dr Amil Amen in [place].  He has moved there and Dr Amil Amen was once with [facility] cardiology. However, they see new patients by physician referral.  Please fax referral to [phone] attn Mallory.  If any problems, please call pt.

## 2014-01-12 NOTE — Telephone Encounter (Signed)
New message     FYI Pt want Dr Johney Frame to know he has moved to [place] and is going to see a doctor there.  He received a recall notice to call and schedule an appt for nov.

## 2014-01-13 NOTE — Progress Notes (Signed)
Loop recorder 

## 2014-01-13 NOTE — Telephone Encounter (Signed)
DO I need to do anything with this?

## 2014-01-13 NOTE — Telephone Encounter (Signed)
Will forward to Dr Mayford Knife as she is his primary cardiologist and Dr Johney Frame is out until 8/12

## 2014-01-13 NOTE — Telephone Encounter (Signed)
Please refer patient to Dr Corliss Marcus for Cardiology care closer to his home

## 2014-01-13 NOTE — Telephone Encounter (Signed)
Dr Johney Frame is out until 8/12 would appreciate your help with this

## 2014-01-13 NOTE — Telephone Encounter (Signed)
Joseph Allison you guys just put pacemaker in for him. The pt wants Dr to follow him for that. Pt is still seeing [place] for normal cardiology

## 2014-01-13 NOTE — Telephone Encounter (Signed)
Dr Mayford Knife can you give the ok for pt to be transferred due to distance he has to travel to get to our office.

## 2014-01-14 ENCOUNTER — Other Ambulatory Visit: Payer: Self-pay | Admitting: General Surgery

## 2014-01-14 DIAGNOSIS — I77810 Thoracic aortic ectasia: Secondary | ICD-10-CM

## 2014-01-14 DIAGNOSIS — I4891 Unspecified atrial fibrillation: Secondary | ICD-10-CM

## 2014-01-14 LAB — MDC_IDC_ENUM_SESS_TYPE_REMOTE
Date Time Interrogation Session: 20150727141724
Zone Setting Detection Interval: 2000 ms
Zone Setting Detection Interval: 3000 ms
Zone Setting Detection Interval: 380 ms

## 2014-01-14 NOTE — Telephone Encounter (Signed)
Order in [facility] and sent to scheduling. They will call pt to make aware once referral has been faxed.

## 2014-01-15 ENCOUNTER — Encounter: Payer: Self-pay | Admitting: Internal Medicine

## 2014-01-25 ENCOUNTER — Ambulatory Visit: Payer: Self-pay | Admitting: Internal Medicine

## 2014-01-25 DIAGNOSIS — Z5181 Encounter for therapeutic drug level monitoring: Secondary | ICD-10-CM

## 2014-01-26 ENCOUNTER — Encounter: Payer: Self-pay | Admitting: Internal Medicine

## 2014-01-26 ENCOUNTER — Telehealth: Payer: Self-pay | Admitting: *Deleted

## 2014-01-26 NOTE — Telephone Encounter (Signed)
Pt was called to schedule appointment per JA to discuss pauses. Pt had appointment on 01-25-14 with Dr Amil Amen in [place]. Pt to have ppm implanted on 02-08-14 by Dr Amil Amen due to pauses. JA aware.

## 2014-01-28 ENCOUNTER — Encounter: Payer: Self-pay | Admitting: Internal Medicine

## 2014-01-29 ENCOUNTER — Other Ambulatory Visit: Payer: Self-pay | Admitting: Cardiology

## 2014-02-04 ENCOUNTER — Encounter: Payer: Self-pay | Admitting: Internal Medicine

## 2014-02-08 ENCOUNTER — Ambulatory Visit (INDEPENDENT_AMBULATORY_CARE_PROVIDER_SITE_OTHER): Payer: Medicare Other | Admitting: *Deleted

## 2014-02-08 DIAGNOSIS — R55 Syncope and collapse: Secondary | ICD-10-CM

## 2014-02-11 NOTE — Progress Notes (Signed)
Loop recorder 

## 2014-02-12 ENCOUNTER — Encounter: Payer: Self-pay | Admitting: Internal Medicine

## 2014-02-15 LAB — MDC_IDC_ENUM_SESS_TYPE_REMOTE: Date Time Interrogation Session: 20150603040500

## 2014-02-18 ENCOUNTER — Telehealth: Payer: Self-pay | Admitting: Internal Medicine

## 2014-02-18 NOTE — Telephone Encounter (Signed)
New message    Patient calling need to discuss his loop recorder.

## 2014-02-18 NOTE — Telephone Encounter (Signed)
Spoke with patient and he has moved to [facility] and Dr Ty Hilts Implanted a PPM.  He has questions about billing from his LINQ that Dr Johney Frame placed.  I transferred him to billing to leave a message and let him know they may give him another number to call to have his questions answered

## 2014-02-23 LAB — MDC_IDC_ENUM_SESS_TYPE_REMOTE: MDC IDC SESS DTM: 20150828040500

## 2014-02-24 ENCOUNTER — Encounter: Payer: Self-pay | Admitting: Internal Medicine

## 2014-03-02 ENCOUNTER — Other Ambulatory Visit: Payer: Self-pay | Admitting: Cardiology

## 2014-03-03 ENCOUNTER — Encounter: Payer: Self-pay | Admitting: Internal Medicine

## 2014-03-26 ENCOUNTER — Other Ambulatory Visit: Payer: Self-pay

## 2014-04-19 ENCOUNTER — Telehealth: Payer: Self-pay | Admitting: Cardiology

## 2014-04-19 NOTE — Telephone Encounter (Signed)
New message     Pt need VPAP S supplies.  He needs a presc faxed to [facility] at [phone] or call them at [phone].  If any problems please call pt

## 2014-04-19 NOTE — Telephone Encounter (Signed)
Will forward to [employer] RN

## 2014-04-29 ENCOUNTER — Telehealth: Payer: Self-pay | Admitting: Internal Medicine

## 2014-04-29 NOTE — Telephone Encounter (Signed)
New Msg  Patient returning call but no msg listed.

## 2014-04-29 NOTE — Telephone Encounter (Signed)
F/U    Patient calling back states he listened to his msg and noticed it was nurse Orpha Bur called him.

## 2014-04-29 NOTE — Telephone Encounter (Signed)
Called [facility] to get new supplies. [facility] representative instructed to send Rx with pt's insurance, diagnosis, face mask and size, and kind of machine to receive an order for general supplies for the patient.   Left message on patient's voicemail to call me back with CPAP information.

## 2014-04-29 NOTE — Telephone Encounter (Signed)
Spoke with patient about CPAP supplies.  Informed patient an Rx will be sent today and to call us if there are any questions or concerns.

## 2014-04-29 NOTE — Telephone Encounter (Signed)
Rx faxed to [facility].

## 2014-05-03 ENCOUNTER — Other Ambulatory Visit: Payer: Self-pay | Admitting: Cardiology

## 2014-05-05 ENCOUNTER — Encounter: Payer: Self-pay | Admitting: *Deleted

## 2014-05-12 ENCOUNTER — Telehealth: Payer: Self-pay | Admitting: *Deleted

## 2014-05-12 NOTE — Telephone Encounter (Signed)
New Message  Pt [facility] in [place] in Oct.

## 2014-05-13 ENCOUNTER — Telehealth: Payer: Self-pay | Admitting: Cardiology

## 2014-05-13 NOTE — Telephone Encounter (Signed)
Patient states he is having trouble getting affordable CPAP supplies after changing insurances.  Patient st he spoke with Renae Fickle at [facility] and he needs an Rx with  1) Patient name 2) items to be ordered 3) NPI #  4) signature 5) date of order  Informed patient this was sent to [facility] but that another Rx will be sent.

## 2014-05-13 NOTE — Telephone Encounter (Signed)
New Msg   Pt calling to ask questions about paperwork for medicare, states it returned to him as incomplete. Please contact pt at [phone].

## 2014-05-14 ENCOUNTER — Telehealth: Payer: Self-pay | Admitting: Internal Medicine

## 2014-05-14 NOTE — Telephone Encounter (Signed)
New message      Pt got a letter from [place] regarding not getting device checks.  He said Dr Amil Amen in [facility] put in a pacemaker in august 2015 and he does not use his loop recorder any more.  Please update our notes.

## 2014-05-17 NOTE — Telephone Encounter (Signed)
Noted in paceart.  

## 2014-05-20 ENCOUNTER — Encounter (HOSPITAL_COMMUNITY): Payer: Self-pay | Admitting: Internal Medicine

## 2014-05-24 NOTE — Telephone Encounter (Signed)
Spoke to [place] rep and he stated that all Joseph Allison needs is a face to face OV faxed since 12/2013.  Called patient and informed him. OV scheduled for 06/17/14 at 2016 per patient request.  OV will be faxed to [facility] after appointment.

## 2014-05-31 ENCOUNTER — Telehealth: Payer: Self-pay | Admitting: Cardiology

## 2014-05-31 NOTE — Telephone Encounter (Signed)
Patient asking if he needs to bring his sleep machine in for his OV in January. Instructed patient to bring the card inside of the machine. Patient st he will bring the entire machine because he can't get it out, and he was told that is fine.

## 2014-05-31 NOTE — Telephone Encounter (Signed)
New Message  Pt requested to speak with Rn. Please call back and discuss.

## 2014-06-01 ENCOUNTER — Other Ambulatory Visit: Payer: Self-pay | Admitting: Cardiology

## 2014-06-14 ENCOUNTER — Other Ambulatory Visit: Payer: Self-pay | Admitting: Cardiology

## 2014-06-17 ENCOUNTER — Encounter: Payer: Self-pay | Admitting: Cardiology

## 2014-06-17 ENCOUNTER — Ambulatory Visit (INDEPENDENT_AMBULATORY_CARE_PROVIDER_SITE_OTHER): Payer: Medicare HMO | Admitting: Cardiology

## 2014-06-17 DIAGNOSIS — G4733 Obstructive sleep apnea (adult) (pediatric): Secondary | ICD-10-CM

## 2014-06-17 NOTE — Patient Instructions (Signed)
Your physician wants you to follow-up in: 6 months with Dr. Turner. You will receive a reminder letter in the mail two months in advance. If you don't receive a letter, please call our office to schedule the follow-up appointment.  

## 2014-06-17 NOTE — Progress Notes (Signed)
[address] [place], [place]  [address] Phone: [phone] Fax:  [phone]  Date:  06/17/2014   ID:  Joseph Allison, DOB [birth year], MRN [id number]  PCP:  Quintella Reichert, MD  Sleep Meidcine:  Armanda Magic, MD    History of Present Illness: Joseph Allison is a 74 y.o. 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 He moved to [place] last summer and is now seeing Dr. Amil Amen for his cardiac problems.  He is back today for followup of his OSA.  He needs new BiPAP supplies and needed a face to face encounter to get new supplies.  He uses the full face mask which he tolerates well.  He feels the pressure is adequate.  He feels rested when he gets up in the am but does wake up frequently at night because the mask leaks and he needs a new mask.  He denies any excessive daytime sleepiness.  He does not know if he snores.   Wt Readings from Last 3 Encounters:  06/17/14 188 lb (85.276 kg)  12/03/13 181 lb (82.101 kg)  10/29/13 178 lb (80.74 kg)     Past Medical History  Diagnosis Date  . Thrombocytopenia     a. Followed by Dr. Myna Hidalgo - chronic immune thrombocytopenia.   Marland Kitchen PAF (paroxysmal atrial fibrillation)     a. S/p prior DCCVs. b. Failed DCCV 08/2010 - started on Tikosyn 12/2010.  . Asthma   . NICM (nonischemic cardiomyopathy)     a. Presumed tachycardia mediated EF 45% in 11/2010. b. Nuc stress test 06/2010: no ischemia. One note suggests the patient had a nuc 08/2010 with no ischemia & EF 32% but cannot find evidence of this.  . OSA (obstructive sleep apnea)     moderate on BIPAP at 19/15cm H2O  . Inguinal hernia   . Sinus bradycardia   . Syncope   . Encounter for long-term (current) use of other medications   . Long term (current) use of anticoagulants   . Arrhythmia 2012    new a fib  . Diverticulosis 2010   Per colonoscopy  . Cholelithiasis     cholecystectomy 05/2010 per Dr. Andrey Campanile  . AK (actinic keratosis)     followed by derm    Current Outpatient Prescriptions  Medication Sig Dispense Refill  . amiodarone (PACERONE) 200 MG tablet Take 200 mg by mouth daily.    . [facility] MAGNESIUM 250 MG TABS TAKE 1 TABLET BY MOUTH 3 TIMES A DAY 90 tablet 0  . Multiple Vitamins-Minerals (CENTRUM SILVER PO) Take 1 tablet by mouth daily.      Marland Kitchen warfarin (COUMADIN) 1 MG tablet Takes 2 mg on Monday and 1 mg the rest of the week     No current facility-administered medications for this visit.    Allergies:   No Known Allergies  Social History:  The patient  reports that he quit smoking about 31 years ago. He has never used smokeless tobacco. He reports that he does not drink alcohol or use illicit drugs.   Family History:  The patient's family history includes Breast cancer in his mother; Cervical cancer in his sister; Colon cancer in his mother; Other in an other family member.   ROS:  Please see the history of present illness.      All other systems reviewed and negative.  PHYSICAL EXAM: VS:  BP 138/80 mmHg  Pulse 63  Resp 18  Ht  (1.778 m)  Wt 188 lb (85.276 kg)  BMI 26.98 kg/m2  SpO2 97% Well nourished, well developed, in no acute distress HEENT: normal Neck: no JVD Cardiac:  normal S1, S2; RRR; no murmur Lungs:  clear to auscultation bilaterally, no wheezing, rhonchi or rales Abd: soft, nontender, no hepatomegaly Ext: no edema Skin: warm and dry Neuro:  CNs 2-12 intact, no focal abnormalities noted   ASSESSMENT AND PLAN:  1.  Obstructive sleep apnea on BIPAP.  He needs new supplies.  He is tolerating his device well.  I will get a d/l from his DME.  I will order a new mask and supplies.  Followup with me in 6 months   Signed, Armanda Magic, MD [facility] 06/17/2014 11:38 AM

## 2014-07-01 ENCOUNTER — Telehealth: Payer: Self-pay | Admitting: Cardiology

## 2014-07-01 NOTE — Telephone Encounter (Signed)
New message      Talk to [facility].  He needs supplies for his CPAP machine.  He has info to give you

## 2014-07-01 NOTE — Telephone Encounter (Signed)
Patient gave me ID# to add to information to re-send to [facility].  Last OV printed and faxed, again, including ID#.

## 2014-07-01 NOTE — Telephone Encounter (Signed)
ID # [id number]

## 2014-07-08 ENCOUNTER — Telehealth: Payer: Self-pay | Admitting: Cardiology

## 2014-07-08 NOTE — Telephone Encounter (Signed)
Apologized to patient for inconvenience and told him Joseph Allison would be addressed.  Called [facility]. Spoke to representative who gave new list of items to send. Joseph Allison now requesting original office note from Dr. Mayford Knife pre-sleep study in 2012 and new prescription for supplies.  New prescription written and message sent to Medical Records at [facility] to obtain OV from 2012.  Will fax to [phone] when obtained

## 2014-07-08 NOTE — Telephone Encounter (Signed)
New message     Patient needs to speak with nurse regarding sleep apnea more information needs to be obtain.  Please call.

## 2014-07-12 NOTE — Telephone Encounter (Signed)
New prescription and old OV placed in Medical Records "to be faxed" bin.

## 2014-07-17 ENCOUNTER — Other Ambulatory Visit: Payer: Self-pay | Admitting: Cardiology

## 2014-08-11 ENCOUNTER — Encounter: Payer: Self-pay | Admitting: Internal Medicine

## 2014-08-13 ENCOUNTER — Other Ambulatory Visit: Payer: Self-pay | Admitting: Cardiology

## 2014-09-10 ENCOUNTER — Other Ambulatory Visit: Payer: Self-pay | Admitting: Cardiology

## 2014-09-29 ENCOUNTER — Encounter: Payer: Self-pay | Admitting: Cardiology

## 2014-10-26 ENCOUNTER — Other Ambulatory Visit: Payer: Self-pay | Admitting: Cardiology

## 2014-11-03 IMAGING — CR DG CHEST 2V
2 series · 2 of 2 positions shown · non-contrast
Comparison: 05/17/2009

CLINICAL DATA: Syncope.  Right anterior chest pain.

EXAM:
CHEST  2 VIEW

[w chest pa]
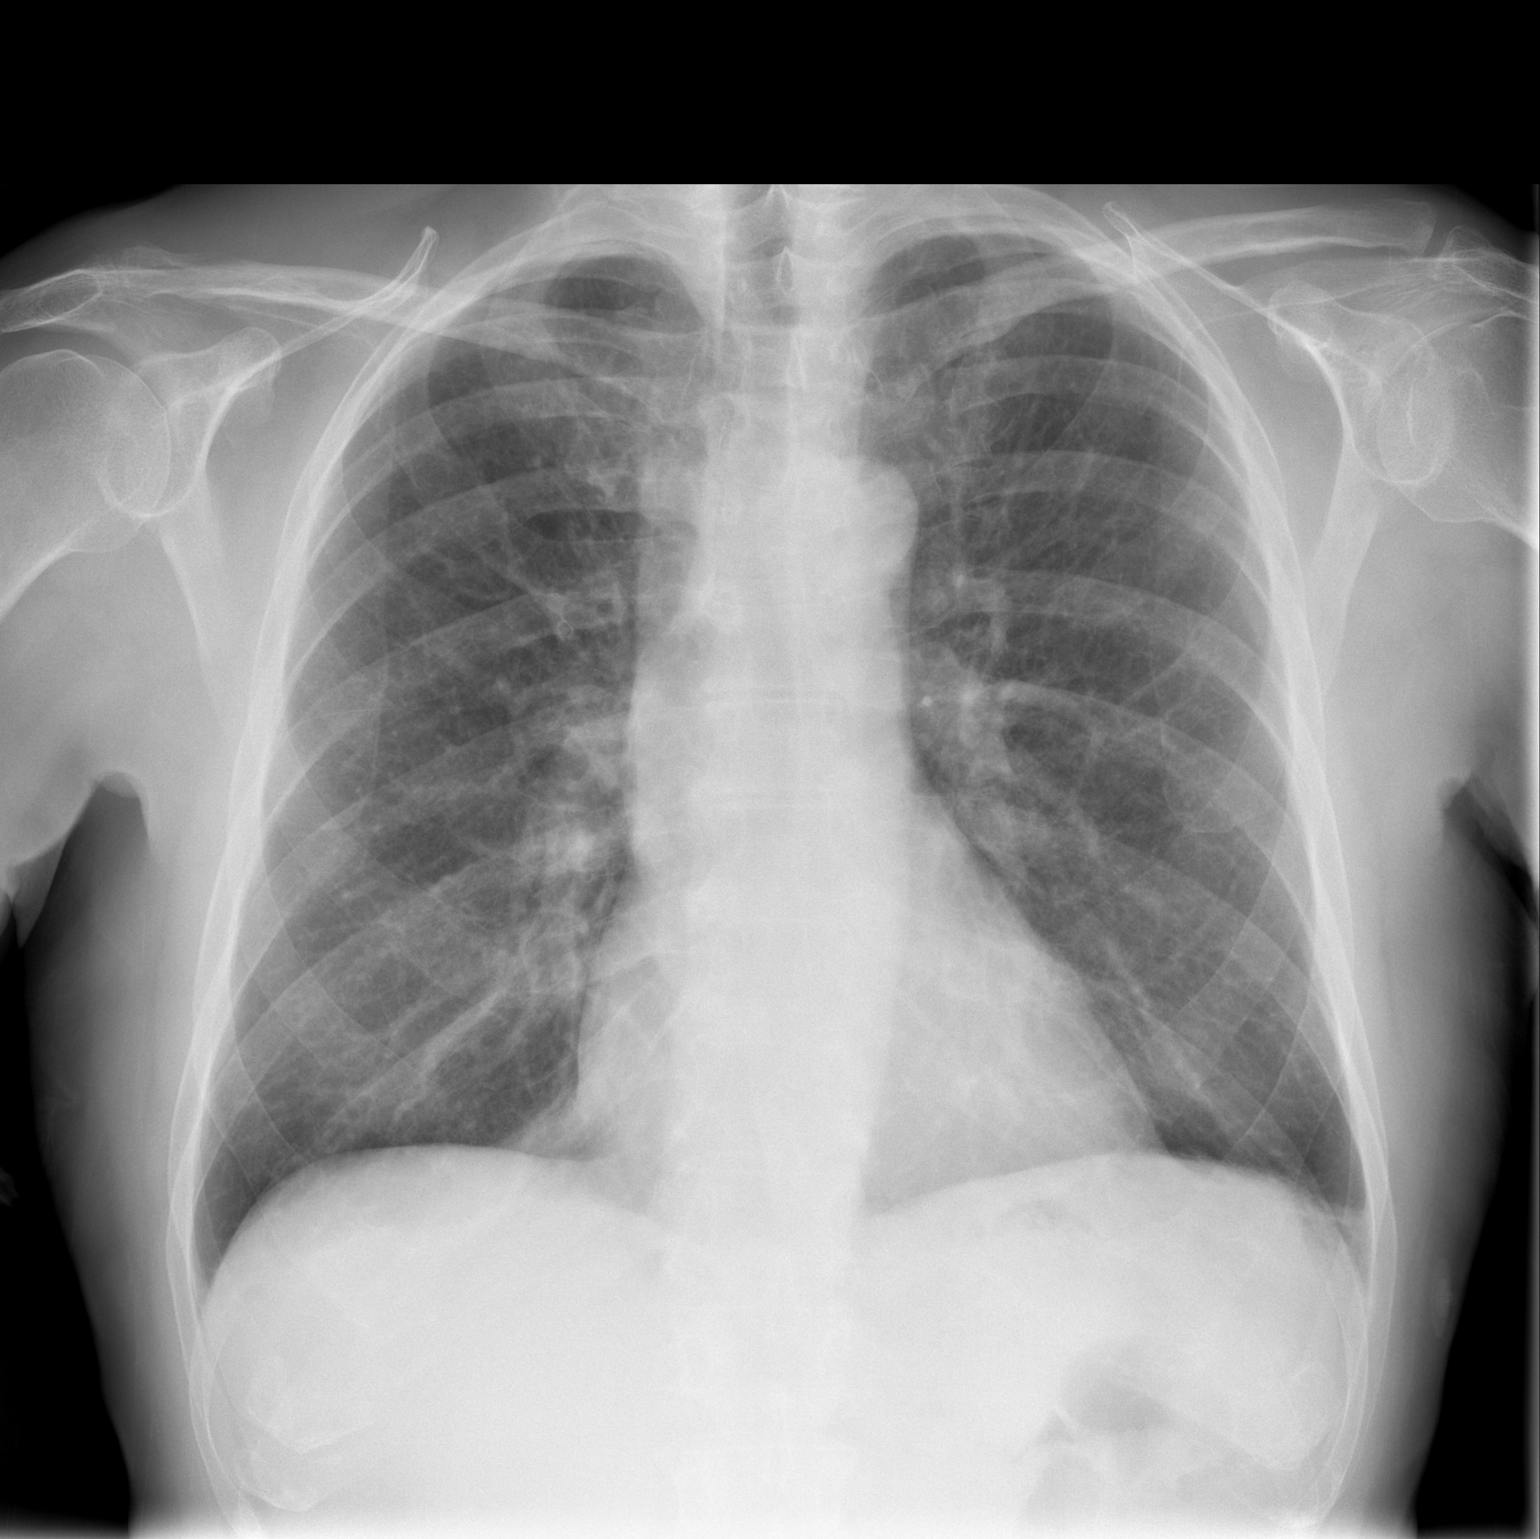

[w chest lat]
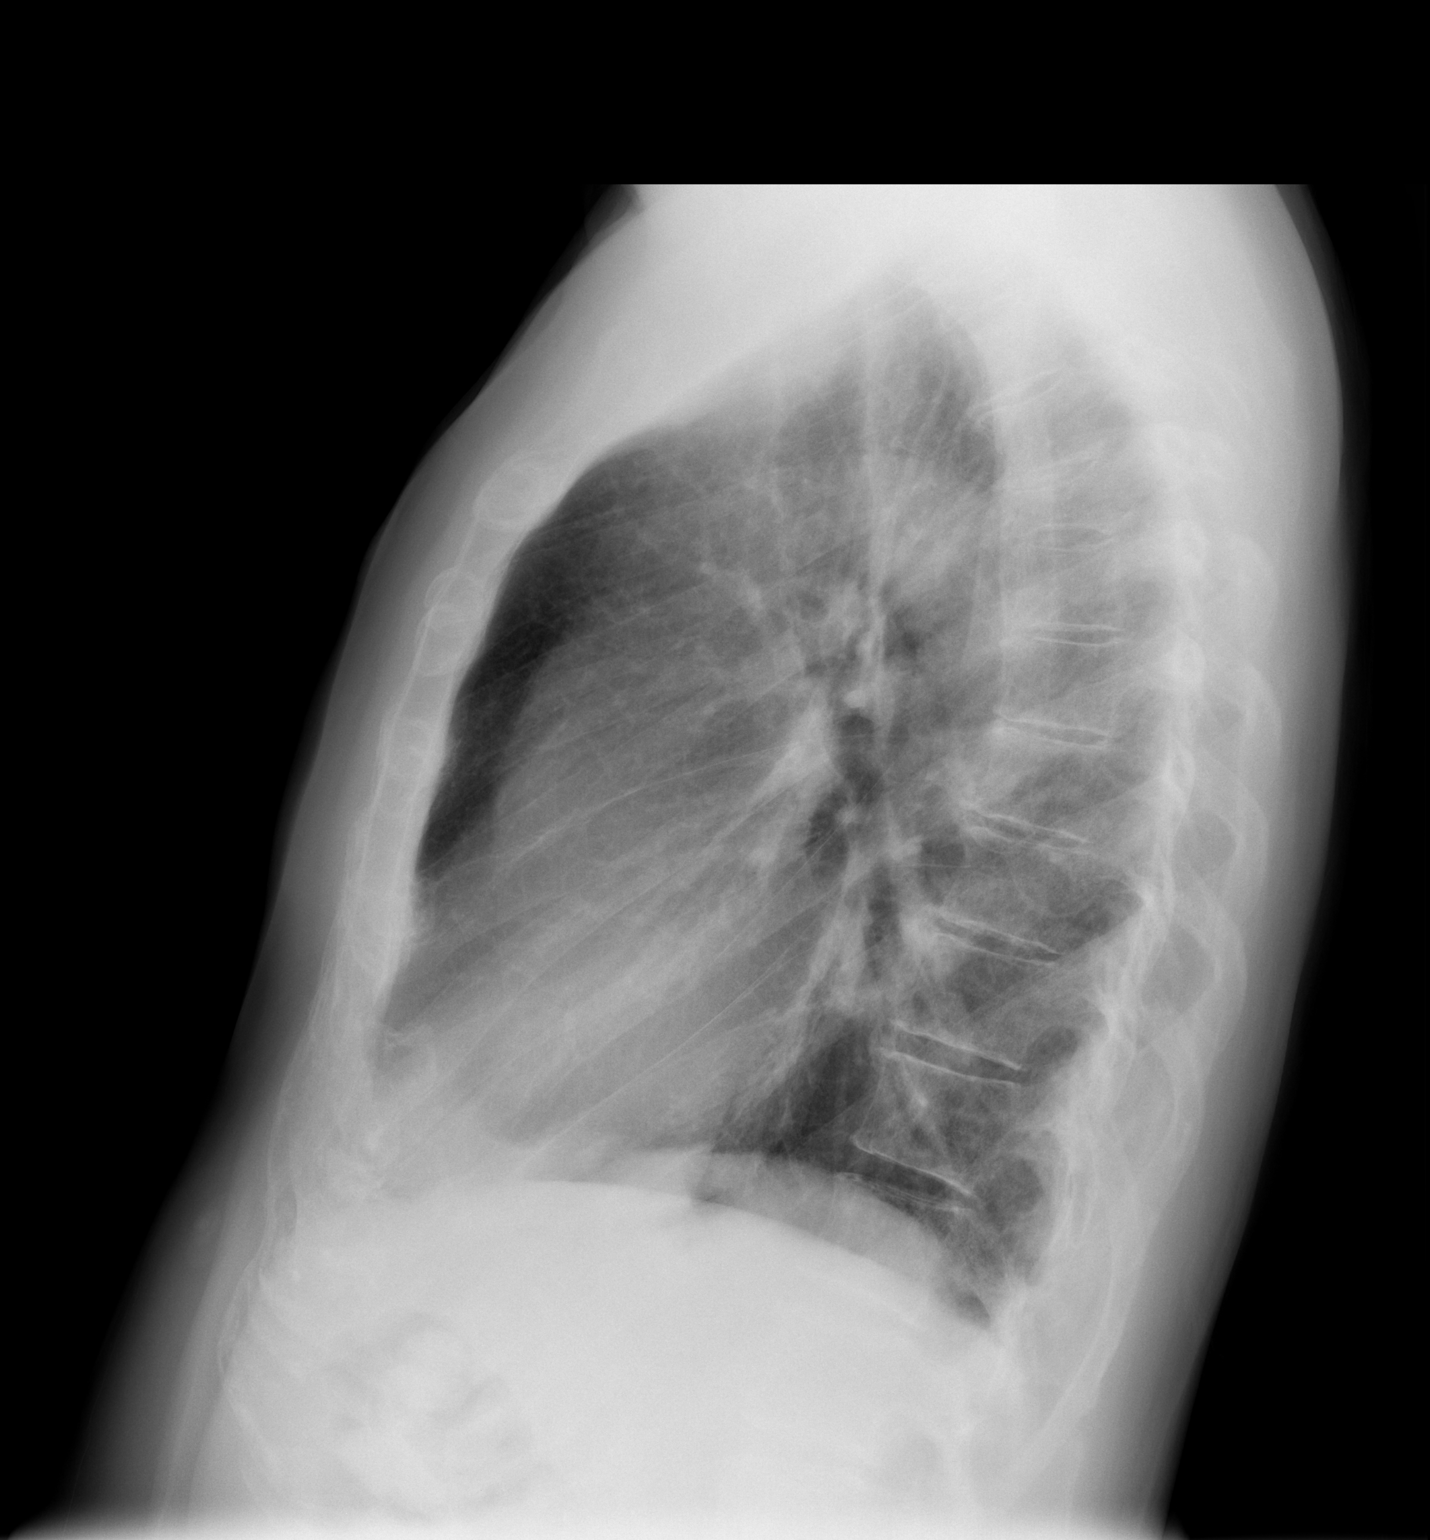

[2 of 2 positions shown; findings below may reference images not displayed]

FINDINGS: Mild interstitial coarsening which is chronic. No edema or
consolidation. No effusion or pneumothorax. Minimal opacity at the
peripheral left base is likely scarring or atelectasis. This will be
seen on abdominal CT which has already been ordered. Remote upper
thoracic compression fracture.
IMPRESSION: 1. No edema or consolidation.
2. Minimal atelectasis or scar at the left base.

## 2014-11-03 IMAGING — CT CT ABD-PELV W/ CM
2 of 5 series · 16 of 46 positions shown, 18 images · IV contrast (APPLIED)
Comparison: None.

CLINICAL DATA: Fall, bruising to right abdomen

EXAM:
CT ABDOMEN AND PELVIS WITH CONTRAST
TECHNIQUE: Multidetector CT imaging of the abdomen and pelvis was performed
using the standard protocol following bolus administration of
intravenous contrast.
CONTRAST:  100mL OMNIPAQUE IOHEXOL 300 MG/ML  SOLN

[Series 2: abd/pelvis 5.0 b31f · axial · 0.71mm/px · z∈[-484,-124]mm · 13 of 82 slices shown, 15 images]
[im 5/82  soft-tissue]
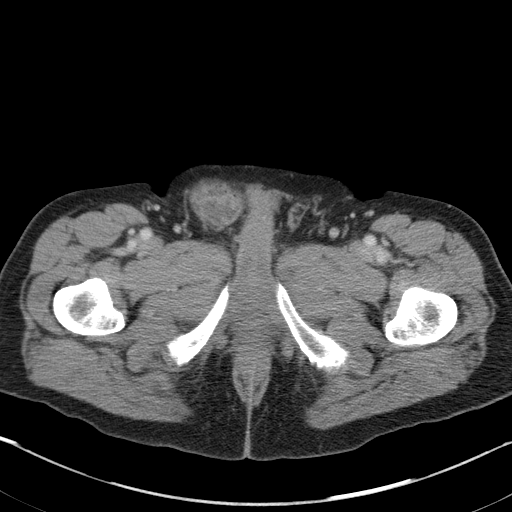
[im 5/82  bone]
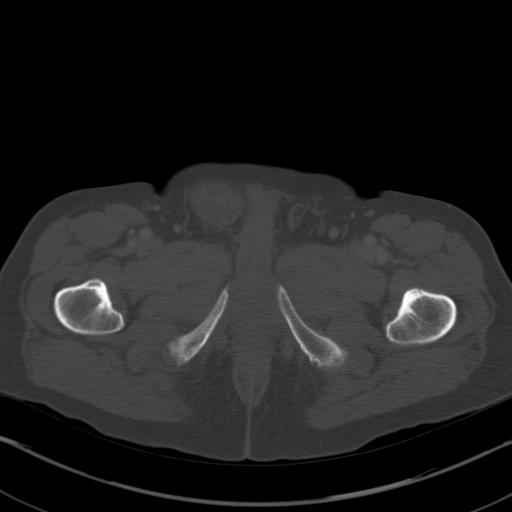
[im 10/82  soft-tissue]
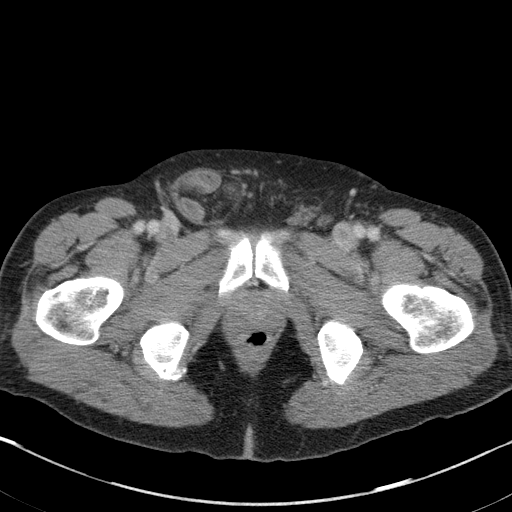
[im 19/82  soft-tissue]
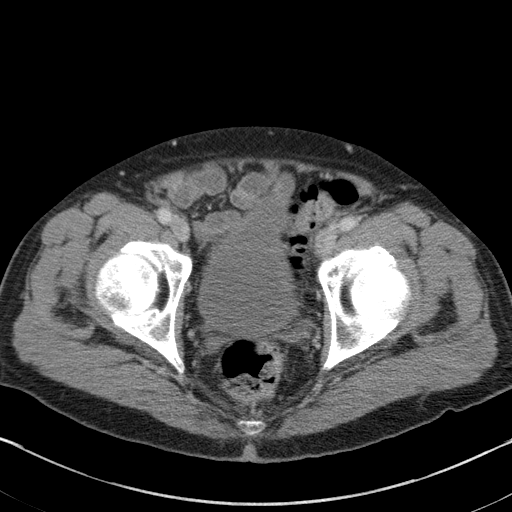
[im 23/82  soft-tissue]
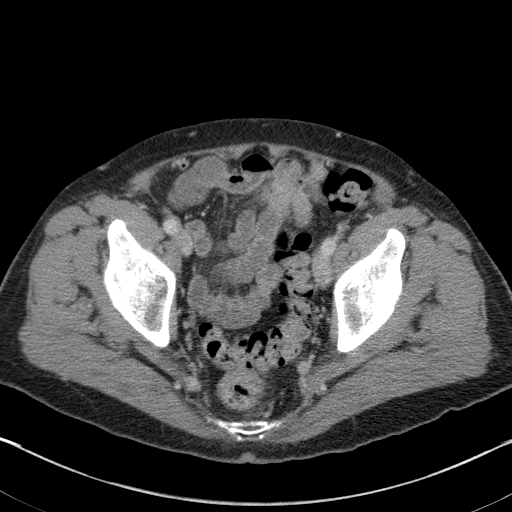
[im 28/82  soft-tissue]
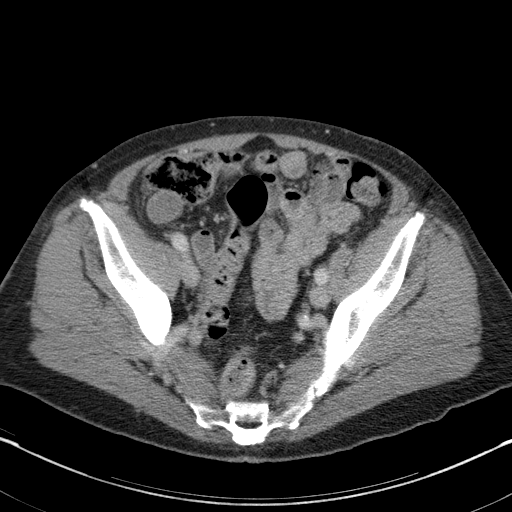
[im 37/82  soft-tissue]
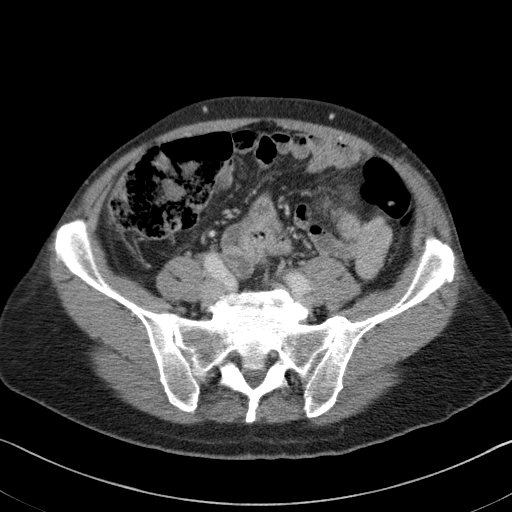
[im 41/82  soft-tissue]
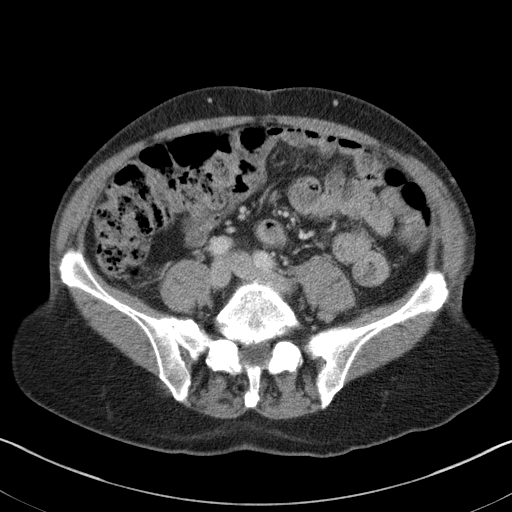
[im 46/82  soft-tissue]
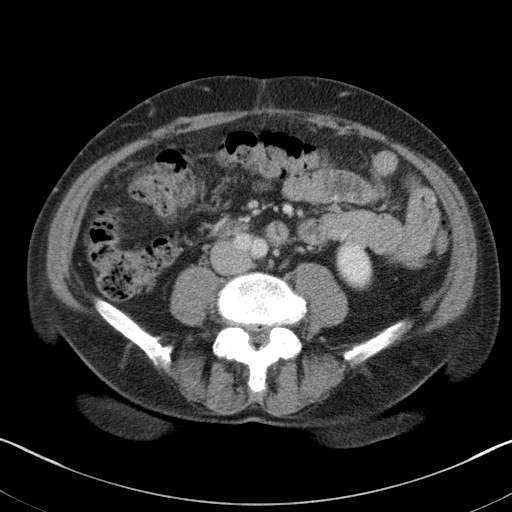
[im 55/82  soft-tissue]
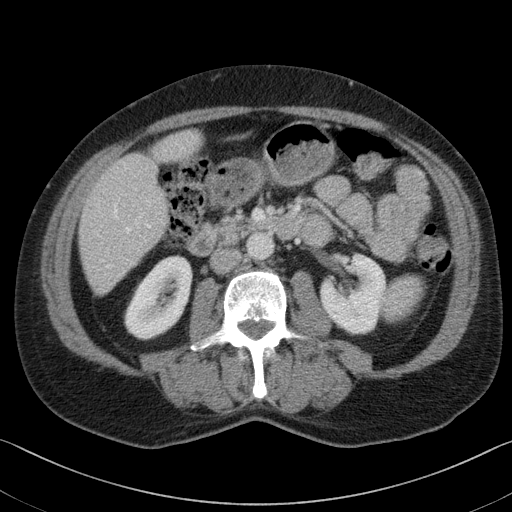
[im 55/82  bone]
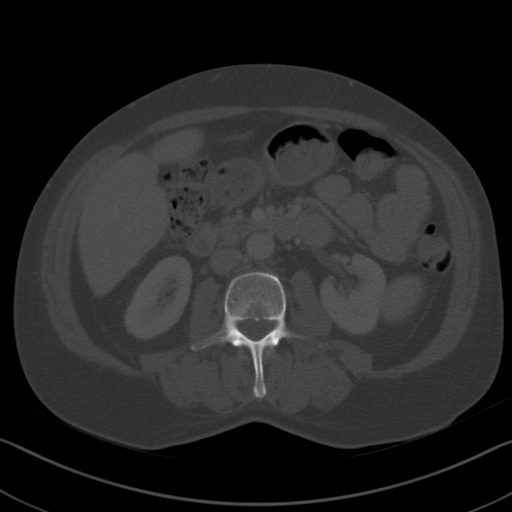
[im 59/82  soft-tissue]
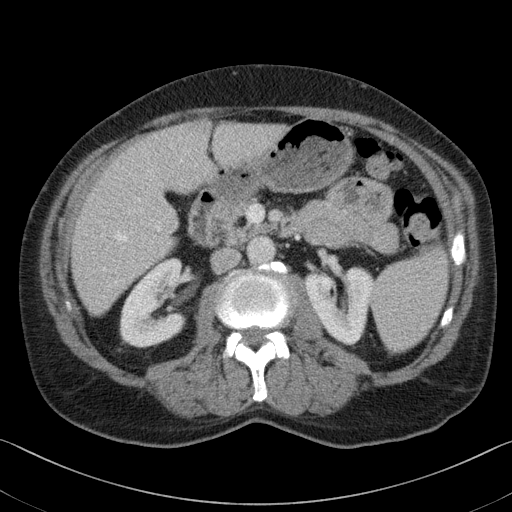
[im 64/82  soft-tissue]
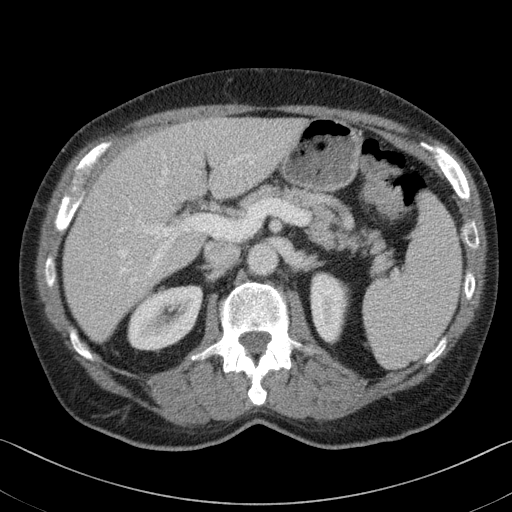
[im 73/82  soft-tissue]
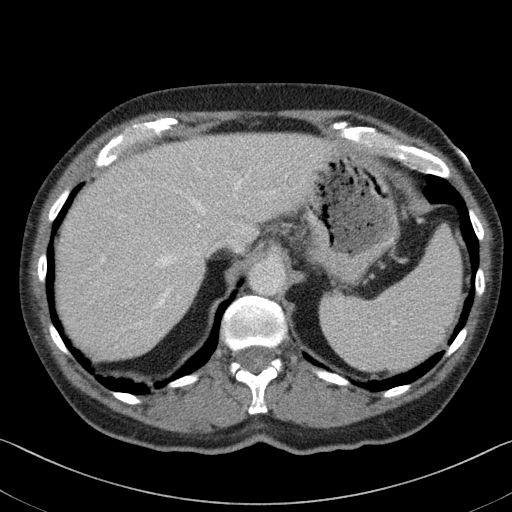
[im 77/82  soft-tissue]
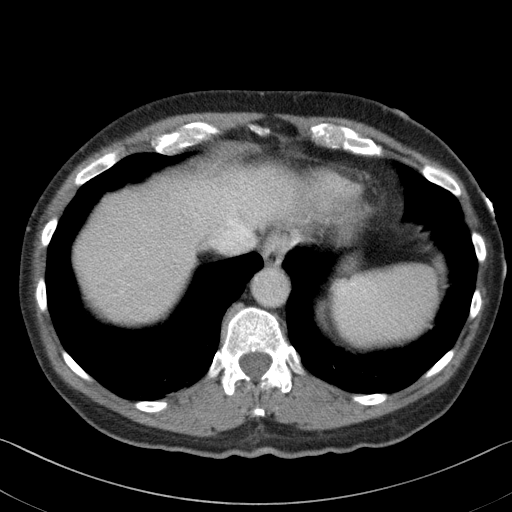

[Series 5: abd/pelvis 3.0 coronal · coronal · 0.78mm/px · 3 of 90 slices shown]
[im 30/90  soft-tissue]
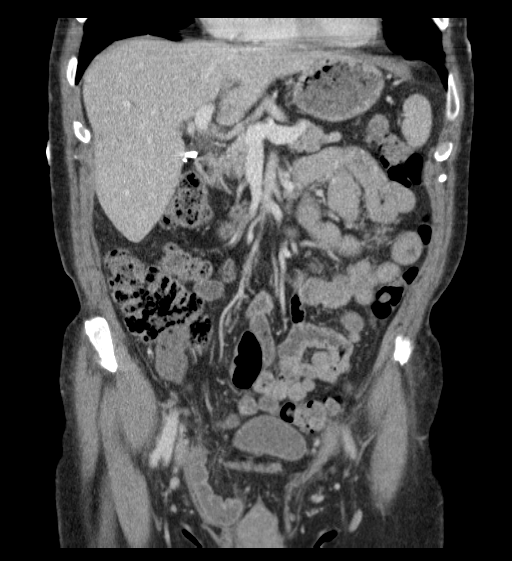
[im 40/90  soft-tissue]
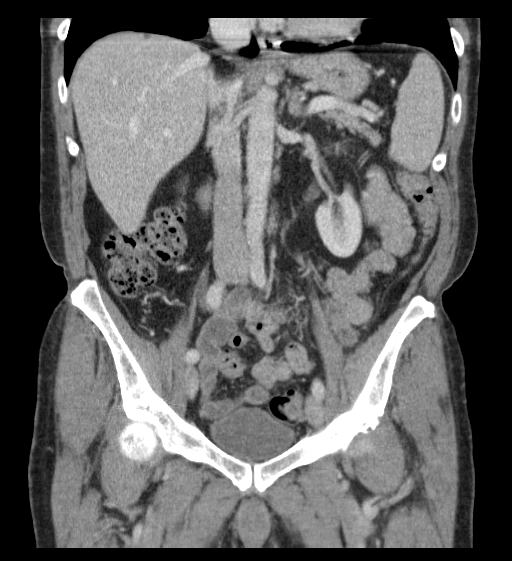
[im 50/90  soft-tissue]
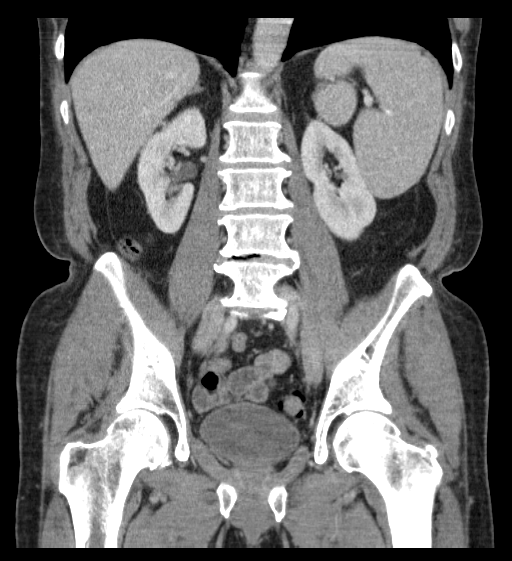

[16 of 46 positions shown; findings below may reference images not displayed]

FINDINGS: Lower Chest: Mild dependent atelectasis in the lower lobes.
Visualized cardiac structures within normal limits for size. No
pericardial effusion. Unremarkable visualized distal thoracic
esophagus.

Abdomen: Unremarkable CT appearance of the stomach, duodenum,
adrenal glands and pancreas. 4 mm circumscribed hypoattenuating
lesion in the hepatic dome is too small to characterize but
statistically likely a benign cyst. Nonspecific 5 mm low-attenuation
lesion in hepatic segment 2. This is also too small to accurately
characterize. Patient is status post cholecystectomy. No intra or
extrahepatic biliary ductal dilatation. Borderline splenomegaly. The
spleen likely remains within normal limits. No focal lesion.
Unremarkable appearance of the bilateral kidneys. No focal solid
lesion, hydronephrosis or nephrolithiasis.

Redundant sigmoid colon. Moderate volume of colonic stool suggests
underlying constipation. Normal appendix in the right lower
quadrant. Unremarkable terminal ileum. No bowel wall thickening or
evidence of obstruction. Right inguinal hernia containing loops of
unremarkable distal ileum. No evidence of hemo peritoneum or solid
organ injury.

Pelvis: Unremarkable bladder, prostate gland and seminal vesicles.
No free fluid or suspicious adenopathy.

Bones/Soft Tissues: No acute fracture or aggressive appearing lytic
or blastic osseous lesion. Multilevel degenerative disc disease in
the lumbar spine most significant at L5-S1.

Vascular: No significant atherosclerotic vascular disease,
aneurysmal dilatation or acute abnormality.
IMPRESSION: 1. No evidence of acute injury to the abdomen or pelvis.
2. Moderately large right inguinal hernia containing loops of
unremarkable distal ileum.
3. Sub cm low-attenuation hepatic lesions. If the patient has no
known history of primary malignancy, these are statistically highly
likely to represent benign cysts. If there is a history of prior
malignancy and metastatic disease is a consideration, recommend
further evaluation with MRI of the abdomen with and without contrast
in 3 months.
4. Multilevel degenerative disc disease most significant at L5-S1.

## 2014-11-03 IMAGING — CT CT HEAD W/O CM
4 of 5 series · 14 of 47 positions shown, 15 images · non-contrast
Comparison: CT of the sinus report dated December 09, 2001 though images
are not available for direct comparison.

CLINICAL DATA: Fall, loss of consciousness.

EXAM:
CT HEAD WITHOUT CONTRAST
CT CERVICAL SPINE WITHOUT CONTRAST
TECHNIQUE: Multidetector CT imaging of the head and cervical spine was
performed following the standard protocol without intravenous
contrast. Multiplanar CT image reconstructions of the cervical spine
were also generated.

[Series 3: c_spine 2.0 b41s st · axial · 0.25mm/px · z∈[+792,+848]mm · 4 of 80 slices shown]
[im 8/80  brain]
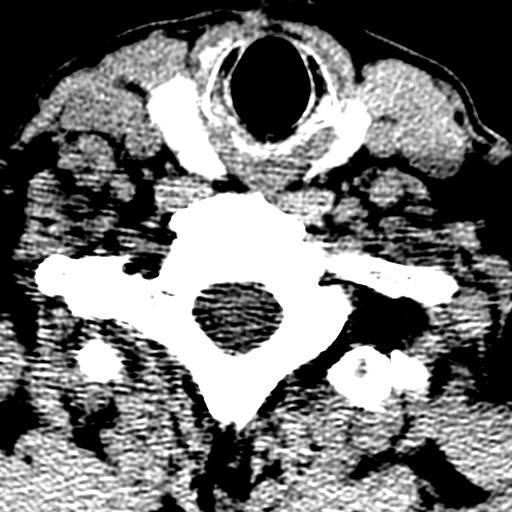
[im 15/80  brain]
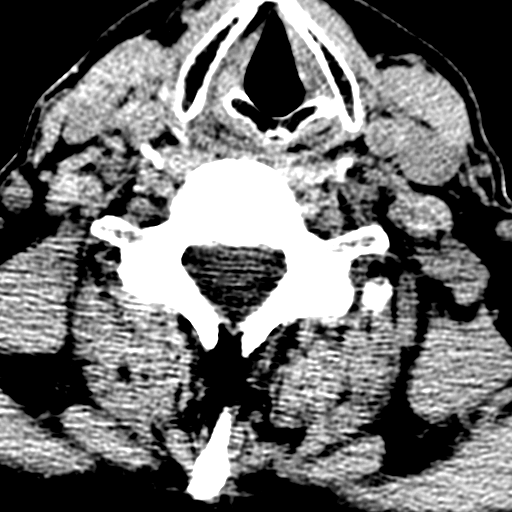
[im 29/80  brain]
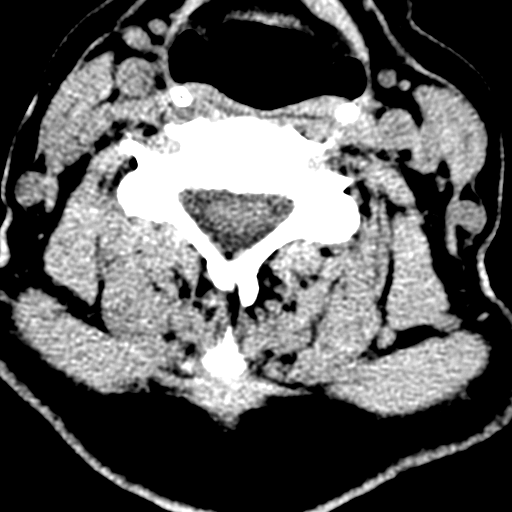
[im 36/80  brain]
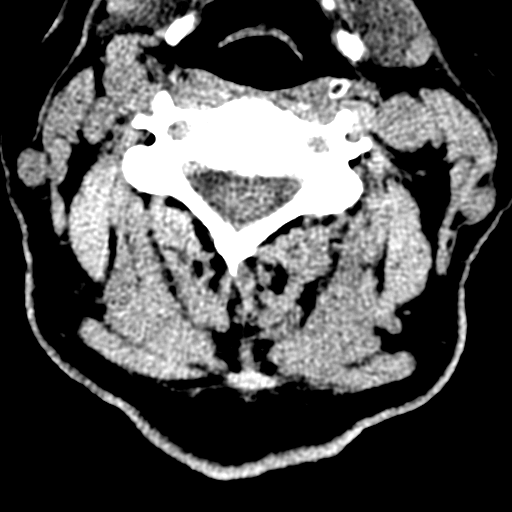

[Series 5: head 4.8 h37s · axial · 0.47mm/px · z∈[+985,+1090]mm · 4 of 36 slices shown, 5 images]
[im 8/36  brain]
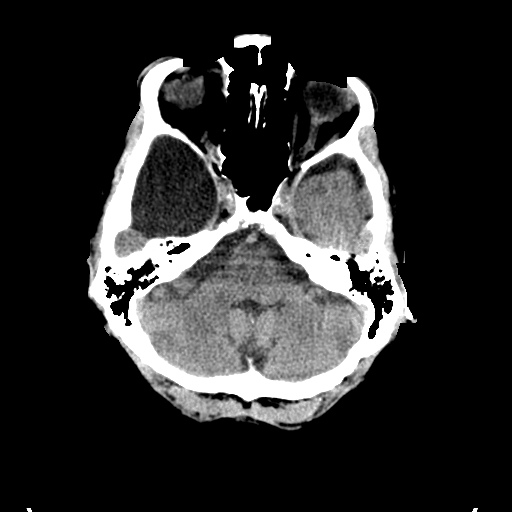
[im 8/36  bone]
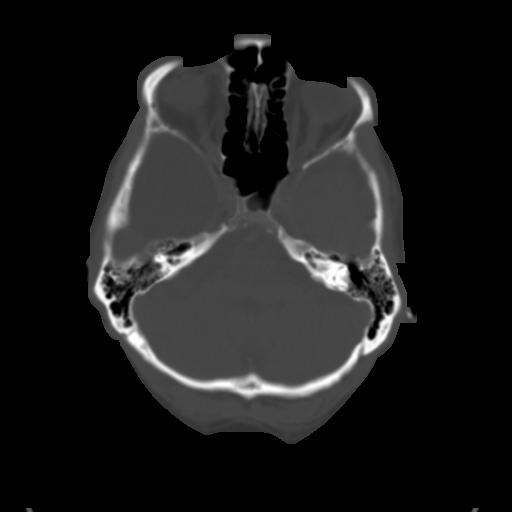
[im 15/36  brain]
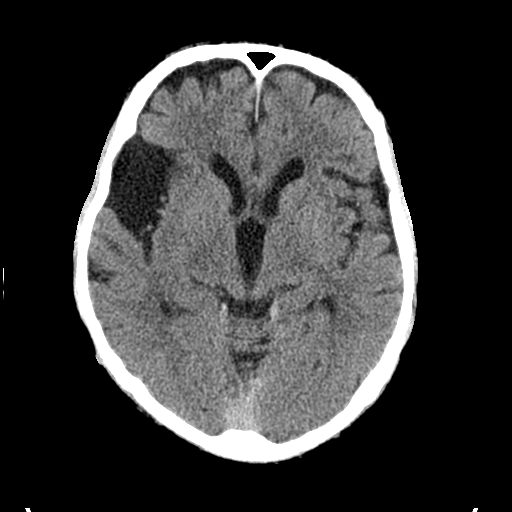
[im 22/36  brain]
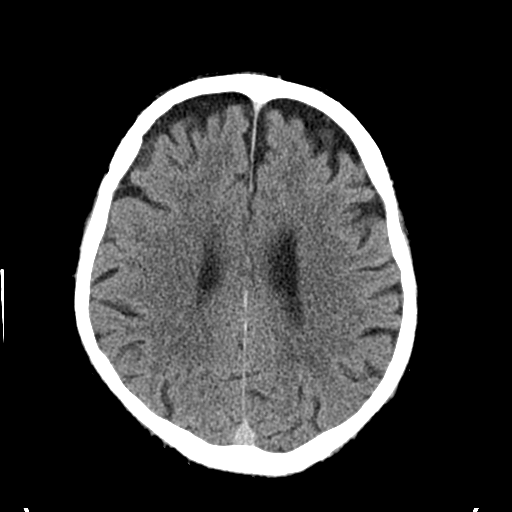
[im 29/36  brain]
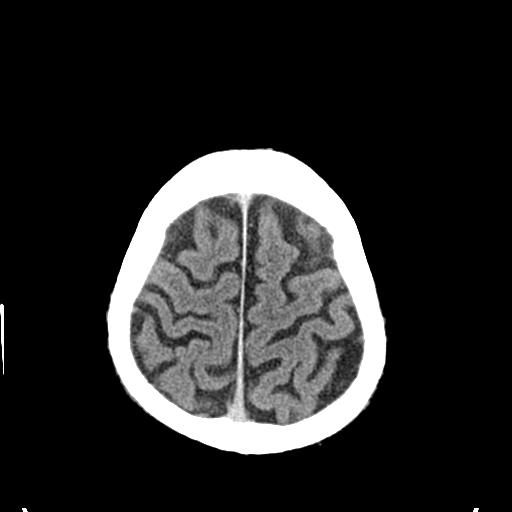

[Series 7: c_spine 2.0 coronal · coronal · 0.18mm/px · 3 of 28 slices shown]
[im 10/28  brain]
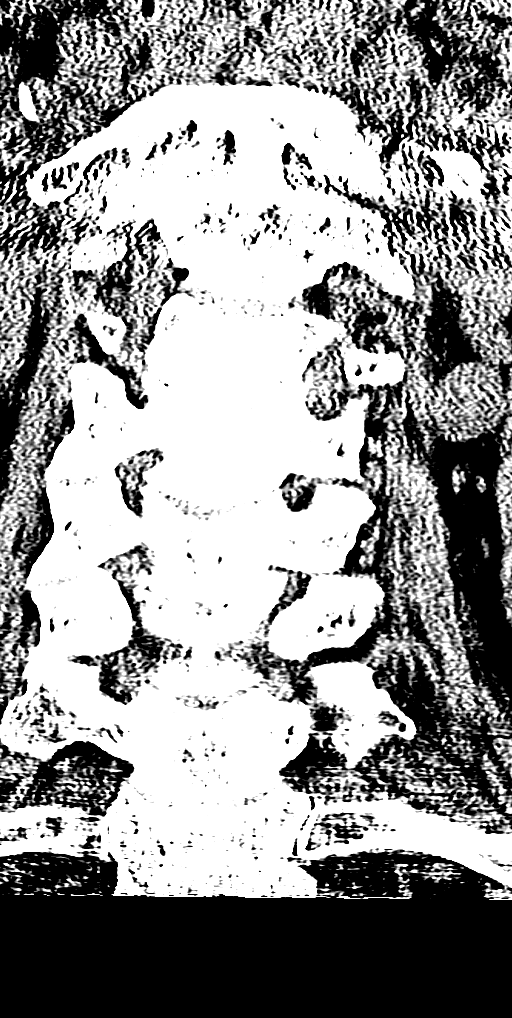
[im 13/28  brain]
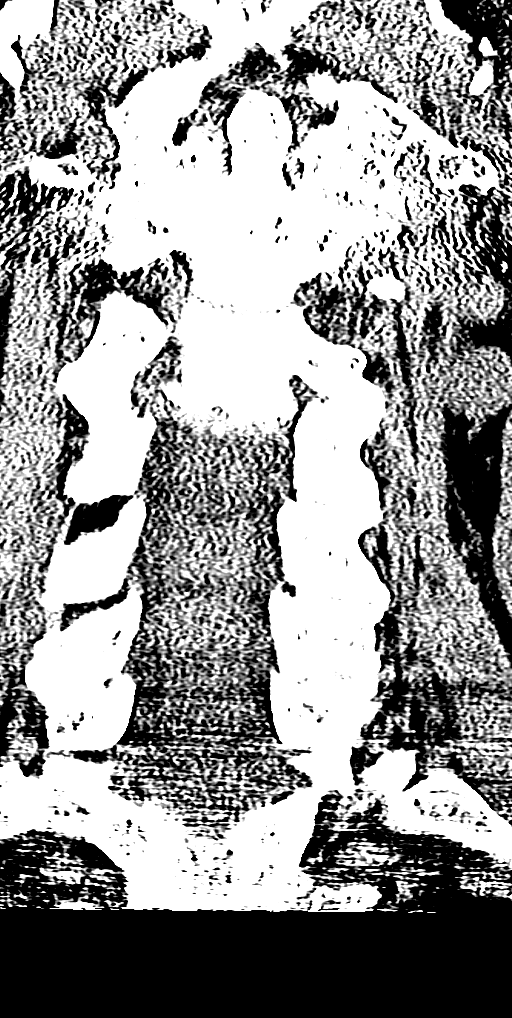
[im 16/28  brain]
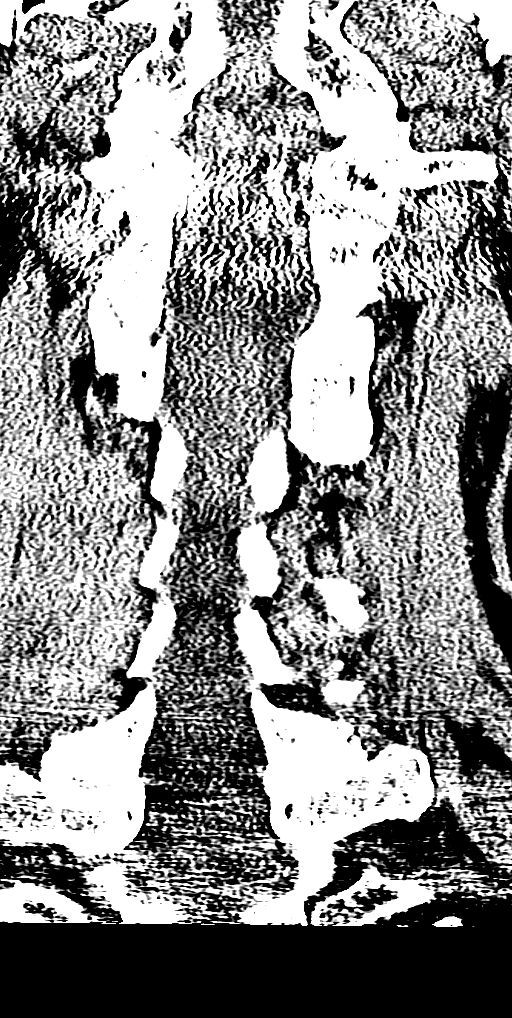

[Series 9: c_spine 2.0 sagittal · sagittal · 0.26mm/px · 3 of 38 slices shown]
[im 13/38  brain]
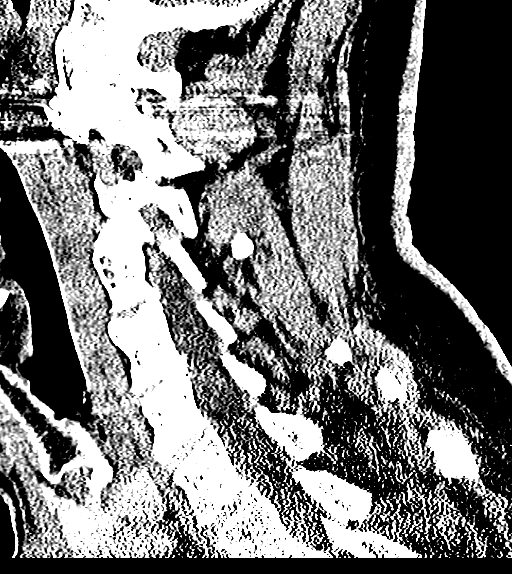
[im 19/38  brain]
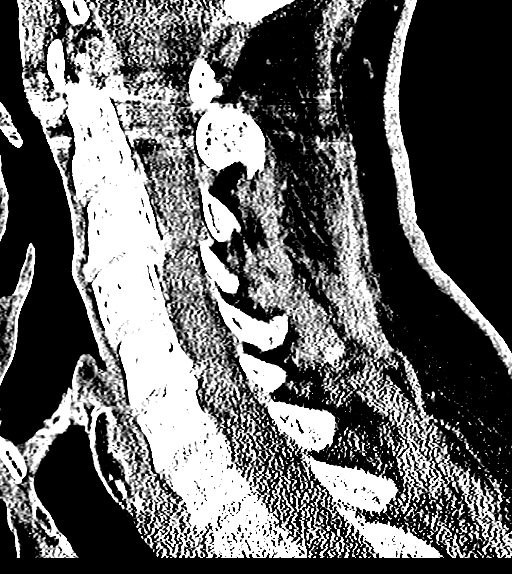
[im 25/38  brain]
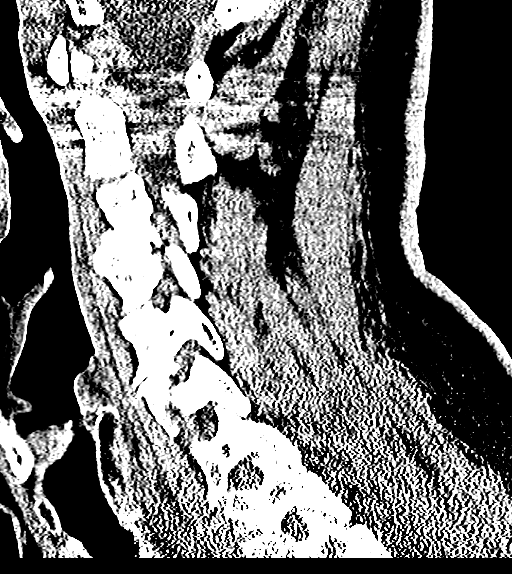

[14 of 47 positions shown; findings below may reference images not displayed]

FINDINGS: CT HEAD FINDINGS

The ventricles and sulci are normal for age. No intraparenchymal
hemorrhage, mass effect nor midline shift. Patchy supratentorial
white matter hypodensities are within normal range for patient's age
and though non-specific suggest sequelae of chronic small vessel
ischemic disease. No acute large vascular territory infarcts.

No abnormal extra-axial fluid collections. Cystic 5.5 x 4.5 cm
extra-axial right middle cranial fossa mass insinuating along the
sylvian fissure most consistent with arachnoid cyst. Basal cisterns
are patent. Moderate calcific atherosclerosis of the carotid
siphons.

No skull fracture. Visualized paranasal sinuses and mastoid aircells
are well-aerated. The included ocular globes and orbital contents
are non-suspicious. Soft tissue within the right external auditory
canal may reflect cerumen.

CT CERVICAL SPINE FINDINGS

Cervical vertebral bodies and posterior elements are intact. Grade 1
C3-4 retrolisthesis on degenerative basis. Moderate to severe C3-4
and moderate C5-6 degenerative disc disease, mild at C4-5. No
destructive bony lesions. Severe right C1-2 arthropathy. No
destructive bony lesions. Included prevertebral and paraspinal soft
tissues are nonsuspicious.

Degenerative disc disease and facet arthropathy result in moderate
canal stenosis at C3-4. Moderate C3-4 and mild to moderate C5-6
neural foraminal narrowing.
IMPRESSION: CT head:  No acute intracranial process.

Right middle cranial fossa arachnoid cyst. Involutional changes.
Mild white matter changes suggest chronic small vessel ischemic
disease.

CT cervical spine: No acute fracture. Grade 1 C3-4 retrolisthesis on
degenerative basis.

  By: Lasonya Pon

## 2014-11-26 ENCOUNTER — Other Ambulatory Visit: Payer: Self-pay | Admitting: Cardiology

## 2014-12-06 ENCOUNTER — Other Ambulatory Visit: Payer: Self-pay

## 2014-12-29 NOTE — Progress Notes (Signed)
Cardiology Office Note   Date:  12/30/2014   ID:  Joseph Allison, DOB 03-21-1941, MRN [id number]  PCP:  Joseph Reichert, MD    Chief Complaint  Patient presents with  . Follow-up    OSA      History of Present Illness: Joseph Allison is a 74 y.o. 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 He moved to [place] last summer and is now seeing Dr. Amil Allison for his cardiac problems. He is back today for followup of his OSA. He uses the full face mask which he tolerates well. He feels the pressure is adequate. He feels rested when he gets up in the am but does wake up frequently at night because the mask leaks and he needs a new mask. He denies any excessive daytime sleepiness. He does not know if he snores.    Past Medical History  Diagnosis Date  . Thrombocytopenia     a. Followed by Dr. Myna Allison - chronic immune thrombocytopenia.   Marland Kitchen PAF (paroxysmal atrial fibrillation)     a. S/p prior DCCVs. b. Failed DCCV 08/2010 - started on Tikosyn 12/2010.  . Asthma   . NICM (nonischemic cardiomyopathy)     a. Presumed tachycardia mediated EF 45% in 11/2010. b. Nuc stress test 06/2010: no ischemia. One note suggests the patient had a nuc 08/2010 with no ischemia & EF 32% but cannot find evidence of this.  . OSA (obstructive sleep apnea)     moderate on BIPAP at 19/15cm H2O  . Inguinal hernia   . Sinus bradycardia   . Syncope   . Encounter for long-term (current) use of other medications   . Long term (current) use of anticoagulants   . Arrhythmia 2012    new a fib  . Diverticulosis 2010    Per colonoscopy  . Cholelithiasis     cholecystectomy 05/2010 per Dr. Andrey Allison  . AK (actinic keratosis)     followed by derm    Past Surgical History  Procedure Laterality Date  . Cholecystectomy    . Loop recorder  implant  09/10/13    MDT LinQ implanted by Dr Joseph Allison for syncope  . Cardioversion  08/2010, 12/2010    A fib, both failed  . Loop recorder implant N/A 09/10/2013    Procedure: LOOP RECORDER IMPLANT;  Surgeon: Joseph Rhyme, MD;  Location: [facility];  Service: Cardiovascular;  Laterality: N/A;     Current Outpatient Prescriptions  Medication Sig Dispense Refill  . CVS MAGNESIUM 250 MG TABS TAKE 1 TABLET BY MOUTH 3 TIMES A DAY 100 tablet 3  . levothyroxine (SYNTHROID, LEVOTHROID) 75 MCG tablet Take 75 mcg by mouth daily before breakfast.    . Multiple Vitamins-Minerals (CENTRUM SILVER PO) Take 1 tablet by mouth daily.      Marland Kitchen warfarin (COUMADIN) 1 MG tablet Takes 2 mg on Monday and 1 mg the rest of the week     No current facility-administered medications for this visit.    Allergies:   Review of patient's allergies indicates no known allergies.    Social History:  The patient  reports that he quit smoking about 31 years ago. He has never used smokeless tobacco. He reports that he does not drink alcohol or use illicit drugs.  Family History:  The patient's family history includes Breast cancer in his mother; Cervical cancer in his sister; Colon cancer in his mother; Other in an other family member.    ROS:  Please see the history of present illness.   Otherwise, review of systems are positive for none.   All other systems are reviewed and negative.    PHYSICAL EXAM: VS:  BP 110/68 mmHg  Pulse 74  Ht  (1.778 m)  Wt 181 lb 12.8 oz (82.464 kg)  BMI 26.09 kg/m2  SpO2 98% , BMI Body mass index is 26.09 kg/(m^2). GEN: Well nourished, well developed, in no acute distress HEENT: normal Neck: no JVD, carotid bruits, or masses Cardiac: irregularly irreg; no murmurs, rubs, or gallops,no edema  Respiratory:  clear to auscultation bilaterally, normal work of breathing GI: soft, nontender, nondistended, + BS MS: no deformity or atrophy Skin: warm and dry, no rash Neuro:  Strength and  sensation are intact Psych: euthymic mood, full affect   EKG:  EKG is not ordered today.    Recent Labs: No results found for requested labs within last 365 days.    Lipid Panel No results found for: CHOL, TRIG, HDL, CHOLHDL, VLDL, LDLCALC, LDLDIRECT    Wt Readings from Last 3 Encounters:  12/30/14 181 lb 12.8 oz (82.464 kg)  06/17/14 188 lb (85.276 kg)  12/03/13 181 lb (82.101 kg)    ASSESSMENT AND PLAN:  1. Obstructive sleep apnea on BIPAP. He is tolerating his device well. Patient has been using and benefiting from CPAP use and will continue to benefit from therapy. I have also instructed the patient on proper sleep hygiene, avoidance of sleeping in the supine position and avoidance of alcohol within 4 hours of bedtime.  The patient was also instructed to avoid driving if sleepy.     Current medicines are reviewed at length with the patient today.  The patient does not have concerns regarding medicines.  The following changes have been made:  no change  Labs/ tests ordered today: See above Assessment and Plan No orders of the defined types were placed in this encounter.     Disposition:   FU with me in 1 year  Signed, Joseph Reichert, MD  12/30/2014 10:42 AM    [facility] [address] [place], [place], [place]  [address] Phone: [phone]; Fax: [phone]

## 2014-12-30 ENCOUNTER — Encounter: Payer: Self-pay | Admitting: Cardiology

## 2014-12-30 ENCOUNTER — Ambulatory Visit (INDEPENDENT_AMBULATORY_CARE_PROVIDER_SITE_OTHER): Payer: Medicare HMO | Admitting: Cardiology

## 2014-12-30 VITALS — BP 110/68 | HR 74 | Ht 70.0 in | Wt 181.8 lb

## 2014-12-30 DIAGNOSIS — G4733 Obstructive sleep apnea (adult) (pediatric): Secondary | ICD-10-CM

## 2014-12-30 NOTE — Patient Instructions (Signed)

## 2015-01-07 ENCOUNTER — Encounter: Payer: Self-pay | Admitting: Cardiology

## 2015-04-15 ENCOUNTER — Encounter: Payer: Self-pay | Admitting: *Deleted

## 2015-05-16 ENCOUNTER — Other Ambulatory Visit: Payer: Self-pay | Admitting: Cardiology

## 2015-10-07 ENCOUNTER — Telehealth: Payer: Self-pay | Admitting: Cardiology

## 2015-10-07 NOTE — Telephone Encounter (Signed)
New Message  A couple of questions about B pap machine.  1. Going to [place] wanted to know if he could miss taking it for about 3 days. Please call back to discuss

## 2015-10-07 NOTE — Telephone Encounter (Signed)
Patient said that he would only be without his machine for two nights.   I told him that from a medical standpoint we have to recommend that he use it every night.  He stated that he understood.  I did tell him that he needed to consider how he would feel the next day if he goes without his machine at night.  I told him ultimately it was his decision, but I explained the negative side effects of being without his machine two nights in a row.   Patient stated verbal understanding and thanked me for the call.

## 2015-12-21 ENCOUNTER — Ambulatory Visit (INDEPENDENT_AMBULATORY_CARE_PROVIDER_SITE_OTHER): Payer: Medicare HMO | Admitting: Cardiology

## 2015-12-21 ENCOUNTER — Encounter: Payer: Self-pay | Admitting: Cardiology

## 2015-12-21 VITALS — BP 104/60 | HR 64 | Ht 70.0 in | Wt 186.2 lb

## 2015-12-21 DIAGNOSIS — G4733 Obstructive sleep apnea (adult) (pediatric): Secondary | ICD-10-CM

## 2015-12-21 NOTE — Addendum Note (Signed)
Addended by: Gunnar Fusi A on: 12/21/2015 11:53 AM   Modules accepted: Orders

## 2015-12-21 NOTE — Progress Notes (Signed)
Cardiology Office Note    Date:  12/21/2015   ID:  LOMAX CENTOLA, DOB 16-Feb-1941, MRN [id number]  PCP:  Armanda Magic, MD  Cardiologist:  Armanda Magic, MD   Chief Complaint  Patient presents with  . Sleep Apnea    History of Present Illness:  Joseph Allison is a 75 y.o. 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 He moved to [facility] and is now seeing Dr. Amil Amen for his cardiac problems. He is back today for followup of his OSA. He uses the full face mask which he tolerates well. He feels the pressure is adequate. He feels rested when he gets up in the am but does wake up frequently at night because the mask leaks and he needs a new mask. He has had some increased daytime sleepiness and has had to nap some.    Past Medical History  Diagnosis Date  . Thrombocytopenia (HCC)     a. Followed by Dr. Myna Hidalgo - chronic immune thrombocytopenia.   Marland Kitchen PAF (paroxysmal atrial fibrillation) (HCC)     a. S/p prior DCCVs. b. Failed DCCV 08/2010 - started on Tikosyn 12/2010.  . Asthma   . NICM (nonischemic cardiomyopathy) (HCC)     a. Presumed tachycardia mediated EF 45% in 11/2010. b. Nuc stress test 06/2010: no ischemia. One note suggests the patient had a nuc 08/2010 with no ischemia & EF 32% but cannot find evidence of this.  . OSA (obstructive sleep apnea)     moderate on BIPAP at 19/15cm H2O  . Inguinal hernia   . Sinus bradycardia   . Syncope   . Encounter for long-term (current) use of other medications   . Long term (current) use of anticoagulants   . Arrhythmia 2012    new a fib  . Diverticulosis 2010    Per colonoscopy  . Cholelithiasis     cholecystectomy 05/2010 per Dr. Andrey Campanile  . AK (actinic keratosis)     followed by derm    Past Surgical History  Procedure Laterality Date  . Cholecystectomy    . Loop recorder  implant  09/10/13    MDT LinQ implanted by Dr Johney Frame for syncope  . Cardioversion  08/2010, 12/2010    A fib, both failed  . Loop recorder implant N/A 09/10/2013    Procedure: LOOP RECORDER IMPLANT;  Surgeon: Gardiner Rhyme, MD;  Location: [facility];  Service: Cardiovascular;  Laterality: N/A;    Current Medications: Outpatient Prescriptions Prior to Visit  Medication Sig Dispense Refill  . CVS MAGNESIUM 250 MG TABS TAKE 1 TABLET BY MOUTH 3 TIMES A DAY 100 tablet 6  . levothyroxine (SYNTHROID, LEVOTHROID) 75 MCG tablet Take 75 mcg by mouth daily before breakfast.    . Multiple Vitamins-Minerals (CENTRUM SILVER PO) Take 1 tablet by mouth daily.      Marland Kitchen warfarin (COUMADIN) 1 MG tablet Takes 2 mg on Monday and 1 mg the rest of the week     No facility-administered medications prior to visit.     Allergies:   Review of patient's allergies indicates no known allergies.   Social History   Social History  . Marital Status: Married    Spouse Name: N/A  . Number of Children: N/A  . Years of Education: N/A   Social History Main Topics  . Smoking status: Former Smoker  Quit date: 04/24/1983  . Smokeless tobacco: Never Used  . Alcohol Use: No  . Drug Use: No  . Sexual Activity: Not Currently   Other Topics Concern  . None   Social History Narrative     Family History:  The patient's family history includes Breast cancer in his mother; Cervical cancer in his sister; Colon cancer in his mother.   ROS:   Please see the history of present illness.    ROS All other systems reviewed and are negative.   PHYSICAL EXAM:   VS:  BP 104/60 mmHg  Pulse 64  Ht  (1.778 m)  Wt 186 lb 3.2 oz (84.46 kg)  BMI 26.72 kg/m2  SpO2 96%   GEN: Well nourished, well developed, in no acute distress HEENT: normal Neck: no JVD, carotid bruits, or masses Cardiac: RRR; no murmurs, rubs, or gallops.  Trace LLE edema.  Intact distal pulses bilaterally.  Respiratory:  clear to auscultation  bilaterally, normal work of breathing GI: soft, nontender, nondistended, + BS MS: no deformity or atrophy Skin: warm and dry, no rash Neuro:  Alert and Oriented x 3, Strength and sensation are intact Psych: euthymic mood, full affect  Wt Readings from Last 3 Encounters:  12/21/15 186 lb 3.2 oz (84.46 kg)  12/30/14 181 lb 12.8 oz (82.464 kg)  06/17/14 188 lb (85.276 kg)      Studies/Labs Reviewed:   EKG:  EKG is not rdered today.    Recent Labs: No results found for requested labs within last 365 days.   Lipid Panel No results found for: CHOL, TRIG, HDL, CHOLHDL, VLDL, LDLCALC, LDLDIRECT  Additional studies/ records that were reviewed today include:  CPAP download    ASSESSMENT:    1. OSA (obstructive sleep apnea)      PLAN:  In order of problems listed above:  OSA - the patient is tolerating PAP therapy well without any problems. The PAP download was reviewed today and showed an AHI of 2.9/hr on 19 cm H2O with 87% compliance in using more than 4 hours nightly.  The patient has been using and benefiting from CPAP use and will continue to benefit from therapy.      Medication Adjustments/Labs and Tests Ordered: Current medicines are reviewed at length with the patient today.  Concerns regarding medicines are outlined above.  Medication changes, Labs and Tests ordered today are listed in the Patient Instructions below.  There are no Patient Instructions on file for this visit.   Signed, Armanda Magic, MD  12/21/2015 11:16 AM    [facility] [address] [place], [place], [place]  [address] Phone: [phone]; Fax: [phone]

## 2015-12-21 NOTE — Patient Instructions (Signed)

## 2015-12-28 ENCOUNTER — Encounter: Payer: Self-pay | Admitting: Cardiology

## 2016-01-09 ENCOUNTER — Telehealth: Payer: Self-pay | Admitting: Cardiology

## 2016-01-09 NOTE — Telephone Encounter (Signed)
Spoke with pt. He reports when he saw Dr.Turner recently he thought new machine was being ordered. He has not received this and is calling to check on status.  I told him I would forward to Dr. Norris Cross nurse to check on this.

## 2016-01-09 NOTE — Telephone Encounter (Signed)
New message   Pt call requesting to speak with RN about vpap machine. Pt calling to f/u with RN to see if it had arrived to office. Please call back to discuss.

## 2016-01-10 NOTE — Telephone Encounter (Signed)
Received message from [facility] that patient is not currently with them for supplies.  Called patient to confirm he would like to switch care to [facility] for PAP needs.  Left message to call back.

## 2016-01-11 NOTE — Telephone Encounter (Signed)
Follow up    Pt verbalized that he is returning rn call and that he is requesting a call from 10-1 or 3-5pm today about his C-PAP machine

## 2016-01-11 NOTE — Telephone Encounter (Signed)
Patient states he is currently with [facility] and will continue to get new products/supplies with them. He understands the order will be sent to them and he will call if he does not hear from them in a few days. He was grateful for follow-up.

## 2016-01-11 NOTE — Telephone Encounter (Signed)
Patient would like to know how much of the new CPAP cost will be covered by insurance. Informed him that Christoper Allegra would know more about cost concerns that [facility].  He requests a call from [place] tomorrow once order is sent to [facility] to see if she knows anything more about coverage. He was grateful for call.

## 2016-01-11 NOTE — Telephone Encounter (Signed)
Follow-up      The pt has a question about his C-Pap machine not other information provided     The pt will be home around 2:30 pm

## 2016-01-17 NOTE — Telephone Encounter (Signed)
Left message for patient to call back  

## 2016-01-24 NOTE — Telephone Encounter (Signed)
Spoke with patient - he is aware that orders have been sent, and that I do not have an answer on the billing side of the machine. He stated verbal understanding and said that he would talk to [place] about billing.

## 2016-02-03 ENCOUNTER — Telehealth: Payer: Self-pay | Admitting: Cardiology

## 2016-02-03 NOTE — Telephone Encounter (Signed)
New message     Pt would like to speak to the nurse about his insurance for his sleep study. Please call.

## 2016-02-03 NOTE — Telephone Encounter (Signed)
Pt calling to see if Dr Mayford Knife or Orpha Bur could check on a new VPAP machine with his current insurance plan.  Pt states that Dr Mayford Knife helped him get his 1st VPAP machine back in 2012, and per [facility], he is due for a new machine.  Pt states with his current insurance plan, he only pays $34 a month for his machine, and wanted Dr Mayford Knife to advise if she thought that a new machine would be the same cost as his current machine. Pt reports his current (old machine) is working appropriately with no issues at all.   Pt states that Christoper Allegra is advising him to get a more current, and up-to-date machine, but he does not want to pay more than he is currently paying, if Dr Mayford Knife advises for him to keep the current machine. Informed the pt that Dr Mayford Knife and Orpha Bur are currently out of the office this afternoon, but I will route this message to them both, for further review and recommendation, and follow-up with the pt thereafter.  Pt verbalized understanding and agrees with this plan. Pt gracious for all the assistance provided.

## 2016-02-03 NOTE — Telephone Encounter (Signed)
It will be up to him if he wants a new machine - I am happy to order him a new one - I do not know what the cost will be so he will have to check with his insurance company or DME

## 2016-02-07 NOTE — Telephone Encounter (Signed)
Patient stated that at this time, he would like to continue using the machine he has.   He said that whenever he is ready for a new machine he will call back.

## 2021-02-09 DEATH — deceased
